# Patient Record
Sex: Male | Born: 1940 | Race: White | Hispanic: No | Marital: Married
Health system: Southern US, Community
[De-identification: ages and names within clinical notes are randomized; demographics above are authoritative.]

---

## 2017-12-24 DIAGNOSIS — E162 Hypoglycemia, unspecified: Secondary | ICD-10-CM | POA: Diagnosis not present

## 2017-12-24 DIAGNOSIS — E11649 Type 2 diabetes mellitus with hypoglycemia without coma: Secondary | ICD-10-CM | POA: Diagnosis not present

## 2017-12-24 DIAGNOSIS — Z7984 Long term (current) use of oral hypoglycemic drugs: Secondary | ICD-10-CM | POA: Diagnosis not present

## 2017-12-24 DIAGNOSIS — R0689 Other abnormalities of breathing: Secondary | ICD-10-CM | POA: Diagnosis not present

## 2017-12-24 DIAGNOSIS — G4700 Insomnia, unspecified: Secondary | ICD-10-CM | POA: Diagnosis not present

## 2017-12-24 DIAGNOSIS — R4182 Altered mental status, unspecified: Secondary | ICD-10-CM | POA: Diagnosis not present

## 2017-12-24 DIAGNOSIS — I10 Essential (primary) hypertension: Secondary | ICD-10-CM | POA: Diagnosis not present

## 2017-12-24 DIAGNOSIS — K219 Gastro-esophageal reflux disease without esophagitis: Secondary | ICD-10-CM | POA: Diagnosis not present

## 2017-12-24 DIAGNOSIS — R410 Disorientation, unspecified: Secondary | ICD-10-CM | POA: Diagnosis not present

## 2017-12-24 DIAGNOSIS — T50905A Adverse effect of unspecified drugs, medicaments and biological substances, initial encounter: Secondary | ICD-10-CM | POA: Diagnosis not present

## 2017-12-24 DIAGNOSIS — E7800 Pure hypercholesterolemia, unspecified: Secondary | ICD-10-CM | POA: Diagnosis not present

## 2017-12-24 DIAGNOSIS — R9431 Abnormal electrocardiogram [ECG] [EKG]: Secondary | ICD-10-CM | POA: Diagnosis not present

## 2017-12-24 DIAGNOSIS — E860 Dehydration: Secondary | ICD-10-CM | POA: Diagnosis not present

## 2017-12-24 DIAGNOSIS — I4510 Unspecified right bundle-branch block: Secondary | ICD-10-CM | POA: Diagnosis not present

## 2017-12-24 DIAGNOSIS — R402441 Other coma, without documented Glasgow coma scale score, or with partial score reported, in the field [EMT or ambulance]: Secondary | ICD-10-CM | POA: Diagnosis not present

## 2017-12-24 DIAGNOSIS — M549 Dorsalgia, unspecified: Secondary | ICD-10-CM | POA: Diagnosis not present

## 2017-12-25 DIAGNOSIS — E11649 Type 2 diabetes mellitus with hypoglycemia without coma: Secondary | ICD-10-CM | POA: Diagnosis not present

## 2017-12-25 DIAGNOSIS — E7800 Pure hypercholesterolemia, unspecified: Secondary | ICD-10-CM | POA: Diagnosis not present

## 2017-12-25 DIAGNOSIS — I10 Essential (primary) hypertension: Secondary | ICD-10-CM | POA: Diagnosis not present

## 2017-12-25 DIAGNOSIS — E860 Dehydration: Secondary | ICD-10-CM | POA: Diagnosis not present

## 2017-12-26 DIAGNOSIS — G4700 Insomnia, unspecified: Secondary | ICD-10-CM | POA: Diagnosis not present

## 2017-12-26 DIAGNOSIS — I10 Essential (primary) hypertension: Secondary | ICD-10-CM | POA: Diagnosis not present

## 2017-12-26 DIAGNOSIS — M1990 Unspecified osteoarthritis, unspecified site: Secondary | ICD-10-CM | POA: Diagnosis not present

## 2017-12-26 DIAGNOSIS — E785 Hyperlipidemia, unspecified: Secondary | ICD-10-CM | POA: Diagnosis not present

## 2017-12-26 DIAGNOSIS — M4186 Other forms of scoliosis, lumbar region: Secondary | ICD-10-CM | POA: Diagnosis not present

## 2017-12-26 DIAGNOSIS — K219 Gastro-esophageal reflux disease without esophagitis: Secondary | ICD-10-CM | POA: Diagnosis not present

## 2017-12-26 DIAGNOSIS — M545 Low back pain: Secondary | ICD-10-CM | POA: Diagnosis not present

## 2017-12-26 DIAGNOSIS — F331 Major depressive disorder, recurrent, moderate: Secondary | ICD-10-CM | POA: Diagnosis not present

## 2017-12-26 DIAGNOSIS — E1142 Type 2 diabetes mellitus with diabetic polyneuropathy: Secondary | ICD-10-CM | POA: Diagnosis not present

## 2017-12-30 DIAGNOSIS — I10 Essential (primary) hypertension: Secondary | ICD-10-CM | POA: Diagnosis not present

## 2017-12-30 DIAGNOSIS — E1165 Type 2 diabetes mellitus with hyperglycemia: Secondary | ICD-10-CM | POA: Diagnosis not present

## 2017-12-30 DIAGNOSIS — E1142 Type 2 diabetes mellitus with diabetic polyneuropathy: Secondary | ICD-10-CM | POA: Diagnosis not present

## 2017-12-30 DIAGNOSIS — Z299 Encounter for prophylactic measures, unspecified: Secondary | ICD-10-CM | POA: Diagnosis not present

## 2017-12-30 DIAGNOSIS — E7800 Pure hypercholesterolemia, unspecified: Secondary | ICD-10-CM | POA: Diagnosis not present

## 2018-01-19 DIAGNOSIS — Z299 Encounter for prophylactic measures, unspecified: Secondary | ICD-10-CM | POA: Diagnosis not present

## 2018-01-19 DIAGNOSIS — Z6823 Body mass index (BMI) 23.0-23.9, adult: Secondary | ICD-10-CM | POA: Diagnosis not present

## 2018-01-19 DIAGNOSIS — I10 Essential (primary) hypertension: Secondary | ICD-10-CM | POA: Diagnosis not present

## 2018-01-19 DIAGNOSIS — E1165 Type 2 diabetes mellitus with hyperglycemia: Secondary | ICD-10-CM | POA: Diagnosis not present

## 2018-01-19 DIAGNOSIS — E1142 Type 2 diabetes mellitus with diabetic polyneuropathy: Secondary | ICD-10-CM | POA: Diagnosis not present

## 2018-03-17 DIAGNOSIS — M25569 Pain in unspecified knee: Secondary | ICD-10-CM | POA: Diagnosis not present

## 2018-03-17 DIAGNOSIS — Z6823 Body mass index (BMI) 23.0-23.9, adult: Secondary | ICD-10-CM | POA: Diagnosis not present

## 2018-03-17 DIAGNOSIS — Z299 Encounter for prophylactic measures, unspecified: Secondary | ICD-10-CM | POA: Diagnosis not present

## 2018-03-17 DIAGNOSIS — Z713 Dietary counseling and surveillance: Secondary | ICD-10-CM | POA: Diagnosis not present

## 2018-03-17 DIAGNOSIS — I10 Essential (primary) hypertension: Secondary | ICD-10-CM | POA: Diagnosis not present

## 2018-04-28 DIAGNOSIS — M1710 Unilateral primary osteoarthritis, unspecified knee: Secondary | ICD-10-CM | POA: Diagnosis not present

## 2018-04-28 DIAGNOSIS — E1165 Type 2 diabetes mellitus with hyperglycemia: Secondary | ICD-10-CM | POA: Diagnosis not present

## 2018-04-28 DIAGNOSIS — Z6823 Body mass index (BMI) 23.0-23.9, adult: Secondary | ICD-10-CM | POA: Diagnosis not present

## 2018-04-28 DIAGNOSIS — Z299 Encounter for prophylactic measures, unspecified: Secondary | ICD-10-CM | POA: Diagnosis not present

## 2018-04-28 DIAGNOSIS — E1142 Type 2 diabetes mellitus with diabetic polyneuropathy: Secondary | ICD-10-CM | POA: Diagnosis not present

## 2018-04-28 DIAGNOSIS — I10 Essential (primary) hypertension: Secondary | ICD-10-CM | POA: Diagnosis not present

## 2018-04-28 DIAGNOSIS — M109 Gout, unspecified: Secondary | ICD-10-CM | POA: Diagnosis not present

## 2018-05-05 DIAGNOSIS — Z299 Encounter for prophylactic measures, unspecified: Secondary | ICD-10-CM | POA: Diagnosis not present

## 2018-05-05 DIAGNOSIS — M25539 Pain in unspecified wrist: Secondary | ICD-10-CM | POA: Diagnosis not present

## 2018-05-05 DIAGNOSIS — Z6823 Body mass index (BMI) 23.0-23.9, adult: Secondary | ICD-10-CM | POA: Diagnosis not present

## 2018-05-05 DIAGNOSIS — I10 Essential (primary) hypertension: Secondary | ICD-10-CM | POA: Diagnosis not present

## 2018-05-05 DIAGNOSIS — M1712 Unilateral primary osteoarthritis, left knee: Secondary | ICD-10-CM | POA: Diagnosis not present

## 2018-05-05 DIAGNOSIS — M79643 Pain in unspecified hand: Secondary | ICD-10-CM | POA: Diagnosis not present

## 2018-05-05 DIAGNOSIS — M1710 Unilateral primary osteoarthritis, unspecified knee: Secondary | ICD-10-CM | POA: Diagnosis not present

## 2018-05-10 DIAGNOSIS — M79643 Pain in unspecified hand: Secondary | ICD-10-CM | POA: Diagnosis not present

## 2018-05-12 DIAGNOSIS — M170 Bilateral primary osteoarthritis of knee: Secondary | ICD-10-CM | POA: Diagnosis not present

## 2018-05-12 DIAGNOSIS — M1711 Unilateral primary osteoarthritis, right knee: Secondary | ICD-10-CM | POA: Diagnosis not present

## 2018-05-12 DIAGNOSIS — M1712 Unilateral primary osteoarthritis, left knee: Secondary | ICD-10-CM | POA: Diagnosis not present

## 2018-05-12 DIAGNOSIS — M19032 Primary osteoarthritis, left wrist: Secondary | ICD-10-CM | POA: Diagnosis not present

## 2018-05-12 DIAGNOSIS — M19041 Primary osteoarthritis, right hand: Secondary | ICD-10-CM | POA: Diagnosis not present

## 2018-05-12 DIAGNOSIS — M19042 Primary osteoarthritis, left hand: Secondary | ICD-10-CM | POA: Diagnosis not present

## 2018-05-12 DIAGNOSIS — M19031 Primary osteoarthritis, right wrist: Secondary | ICD-10-CM | POA: Diagnosis not present

## 2018-08-04 DIAGNOSIS — Z6824 Body mass index (BMI) 24.0-24.9, adult: Secondary | ICD-10-CM | POA: Diagnosis not present

## 2018-08-04 DIAGNOSIS — Z299 Encounter for prophylactic measures, unspecified: Secondary | ICD-10-CM | POA: Diagnosis not present

## 2018-08-04 DIAGNOSIS — E1142 Type 2 diabetes mellitus with diabetic polyneuropathy: Secondary | ICD-10-CM | POA: Diagnosis not present

## 2018-08-04 DIAGNOSIS — I10 Essential (primary) hypertension: Secondary | ICD-10-CM | POA: Diagnosis not present

## 2018-08-04 DIAGNOSIS — E1165 Type 2 diabetes mellitus with hyperglycemia: Secondary | ICD-10-CM | POA: Diagnosis not present

## 2018-12-25 DIAGNOSIS — Z7189 Other specified counseling: Secondary | ICD-10-CM | POA: Diagnosis not present

## 2018-12-25 DIAGNOSIS — Z1339 Encounter for screening examination for other mental health and behavioral disorders: Secondary | ICD-10-CM | POA: Diagnosis not present

## 2018-12-25 DIAGNOSIS — E7800 Pure hypercholesterolemia, unspecified: Secondary | ICD-10-CM | POA: Diagnosis not present

## 2018-12-25 DIAGNOSIS — Z1331 Encounter for screening for depression: Secondary | ICD-10-CM | POA: Diagnosis not present

## 2018-12-25 DIAGNOSIS — Z125 Encounter for screening for malignant neoplasm of prostate: Secondary | ICD-10-CM | POA: Diagnosis not present

## 2018-12-25 DIAGNOSIS — E1165 Type 2 diabetes mellitus with hyperglycemia: Secondary | ICD-10-CM | POA: Diagnosis not present

## 2018-12-25 DIAGNOSIS — Z79899 Other long term (current) drug therapy: Secondary | ICD-10-CM | POA: Diagnosis not present

## 2018-12-25 DIAGNOSIS — Z1211 Encounter for screening for malignant neoplasm of colon: Secondary | ICD-10-CM | POA: Diagnosis not present

## 2018-12-25 DIAGNOSIS — R5383 Other fatigue: Secondary | ICD-10-CM | POA: Diagnosis not present

## 2018-12-25 DIAGNOSIS — F322 Major depressive disorder, single episode, severe without psychotic features: Secondary | ICD-10-CM | POA: Diagnosis not present

## 2018-12-25 DIAGNOSIS — Z0000 Encounter for general adult medical examination without abnormal findings: Secondary | ICD-10-CM | POA: Diagnosis not present

## 2019-01-02 DIAGNOSIS — E1159 Type 2 diabetes mellitus with other circulatory complications: Secondary | ICD-10-CM | POA: Diagnosis not present

## 2019-01-02 DIAGNOSIS — E1140 Type 2 diabetes mellitus with diabetic neuropathy, unspecified: Secondary | ICD-10-CM | POA: Diagnosis not present

## 2019-01-02 DIAGNOSIS — H81393 Other peripheral vertigo, bilateral: Secondary | ICD-10-CM | POA: Diagnosis not present

## 2019-01-15 DIAGNOSIS — F322 Major depressive disorder, single episode, severe without psychotic features: Secondary | ICD-10-CM | POA: Diagnosis not present

## 2019-01-15 DIAGNOSIS — E1142 Type 2 diabetes mellitus with diabetic polyneuropathy: Secondary | ICD-10-CM | POA: Diagnosis not present

## 2019-01-15 DIAGNOSIS — E1165 Type 2 diabetes mellitus with hyperglycemia: Secondary | ICD-10-CM | POA: Diagnosis not present

## 2019-01-15 DIAGNOSIS — Z299 Encounter for prophylactic measures, unspecified: Secondary | ICD-10-CM | POA: Diagnosis not present

## 2019-01-15 DIAGNOSIS — I10 Essential (primary) hypertension: Secondary | ICD-10-CM | POA: Diagnosis not present

## 2019-01-15 DIAGNOSIS — R42 Dizziness and giddiness: Secondary | ICD-10-CM | POA: Diagnosis not present

## 2019-01-15 DIAGNOSIS — Z6824 Body mass index (BMI) 24.0-24.9, adult: Secondary | ICD-10-CM | POA: Diagnosis not present

## 2019-01-22 DIAGNOSIS — E1142 Type 2 diabetes mellitus with diabetic polyneuropathy: Secondary | ICD-10-CM | POA: Diagnosis not present

## 2019-01-22 DIAGNOSIS — F322 Major depressive disorder, single episode, severe without psychotic features: Secondary | ICD-10-CM | POA: Diagnosis not present

## 2019-01-22 DIAGNOSIS — Z6824 Body mass index (BMI) 24.0-24.9, adult: Secondary | ICD-10-CM | POA: Diagnosis not present

## 2019-01-22 DIAGNOSIS — Z299 Encounter for prophylactic measures, unspecified: Secondary | ICD-10-CM | POA: Diagnosis not present

## 2019-01-22 DIAGNOSIS — E1165 Type 2 diabetes mellitus with hyperglycemia: Secondary | ICD-10-CM | POA: Diagnosis not present

## 2019-01-22 DIAGNOSIS — N184 Chronic kidney disease, stage 4 (severe): Secondary | ICD-10-CM | POA: Diagnosis not present

## 2019-01-22 DIAGNOSIS — I10 Essential (primary) hypertension: Secondary | ICD-10-CM | POA: Diagnosis not present

## 2019-03-02 DIAGNOSIS — E1165 Type 2 diabetes mellitus with hyperglycemia: Secondary | ICD-10-CM | POA: Diagnosis not present

## 2019-03-02 DIAGNOSIS — F329 Major depressive disorder, single episode, unspecified: Secondary | ICD-10-CM | POA: Diagnosis not present

## 2019-03-02 DIAGNOSIS — E7800 Pure hypercholesterolemia, unspecified: Secondary | ICD-10-CM | POA: Diagnosis not present

## 2019-04-05 ENCOUNTER — Other Ambulatory Visit: Payer: Self-pay

## 2019-04-05 DIAGNOSIS — Z20822 Contact with and (suspected) exposure to covid-19: Secondary | ICD-10-CM

## 2019-04-06 ENCOUNTER — Telehealth: Payer: Self-pay | Admitting: *Deleted

## 2019-04-06 LAB — NOVEL CORONAVIRUS, NAA: SARS-CoV-2, NAA: NOT DETECTED

## 2019-04-06 NOTE — Telephone Encounter (Signed)
Patient given COVID - 19 test results, verbalized understanding.

## 2019-04-09 DIAGNOSIS — M1710 Unilateral primary osteoarthritis, unspecified knee: Secondary | ICD-10-CM | POA: Diagnosis not present

## 2019-04-09 DIAGNOSIS — E1142 Type 2 diabetes mellitus with diabetic polyneuropathy: Secondary | ICD-10-CM | POA: Diagnosis not present

## 2019-04-09 DIAGNOSIS — E1165 Type 2 diabetes mellitus with hyperglycemia: Secondary | ICD-10-CM | POA: Diagnosis not present

## 2019-04-09 DIAGNOSIS — Z6824 Body mass index (BMI) 24.0-24.9, adult: Secondary | ICD-10-CM | POA: Diagnosis not present

## 2019-04-09 DIAGNOSIS — M545 Low back pain: Secondary | ICD-10-CM | POA: Diagnosis not present

## 2019-04-09 DIAGNOSIS — Z299 Encounter for prophylactic measures, unspecified: Secondary | ICD-10-CM | POA: Diagnosis not present

## 2019-04-09 DIAGNOSIS — I10 Essential (primary) hypertension: Secondary | ICD-10-CM | POA: Diagnosis not present

## 2019-04-10 DIAGNOSIS — Z6823 Body mass index (BMI) 23.0-23.9, adult: Secondary | ICD-10-CM | POA: Diagnosis not present

## 2019-04-10 DIAGNOSIS — E1165 Type 2 diabetes mellitus with hyperglycemia: Secondary | ICD-10-CM | POA: Diagnosis not present

## 2019-04-10 DIAGNOSIS — M1710 Unilateral primary osteoarthritis, unspecified knee: Secondary | ICD-10-CM | POA: Diagnosis not present

## 2019-04-10 DIAGNOSIS — M25562 Pain in left knee: Secondary | ICD-10-CM | POA: Diagnosis not present

## 2019-04-10 DIAGNOSIS — I10 Essential (primary) hypertension: Secondary | ICD-10-CM | POA: Diagnosis not present

## 2019-04-10 DIAGNOSIS — Z789 Other specified health status: Secondary | ICD-10-CM | POA: Diagnosis not present

## 2019-04-10 DIAGNOSIS — M1712 Unilateral primary osteoarthritis, left knee: Secondary | ICD-10-CM | POA: Diagnosis not present

## 2019-04-10 DIAGNOSIS — Z299 Encounter for prophylactic measures, unspecified: Secondary | ICD-10-CM | POA: Diagnosis not present

## 2019-04-12 DIAGNOSIS — M25561 Pain in right knee: Secondary | ICD-10-CM | POA: Diagnosis not present

## 2019-04-12 DIAGNOSIS — Z6823 Body mass index (BMI) 23.0-23.9, adult: Secondary | ICD-10-CM | POA: Diagnosis not present

## 2019-04-12 DIAGNOSIS — E1165 Type 2 diabetes mellitus with hyperglycemia: Secondary | ICD-10-CM | POA: Diagnosis not present

## 2019-04-12 DIAGNOSIS — M25562 Pain in left knee: Secondary | ICD-10-CM | POA: Diagnosis not present

## 2019-04-12 DIAGNOSIS — I10 Essential (primary) hypertension: Secondary | ICD-10-CM | POA: Diagnosis not present

## 2019-04-12 DIAGNOSIS — Z299 Encounter for prophylactic measures, unspecified: Secondary | ICD-10-CM | POA: Diagnosis not present

## 2019-04-25 DIAGNOSIS — E7800 Pure hypercholesterolemia, unspecified: Secondary | ICD-10-CM | POA: Diagnosis not present

## 2019-04-25 DIAGNOSIS — Z299 Encounter for prophylactic measures, unspecified: Secondary | ICD-10-CM | POA: Diagnosis not present

## 2019-04-25 DIAGNOSIS — E1165 Type 2 diabetes mellitus with hyperglycemia: Secondary | ICD-10-CM | POA: Diagnosis not present

## 2019-04-25 DIAGNOSIS — F329 Major depressive disorder, single episode, unspecified: Secondary | ICD-10-CM | POA: Diagnosis not present

## 2019-05-08 DIAGNOSIS — E1165 Type 2 diabetes mellitus with hyperglycemia: Secondary | ICD-10-CM | POA: Diagnosis not present

## 2019-05-24 DIAGNOSIS — I10 Essential (primary) hypertension: Secondary | ICD-10-CM | POA: Diagnosis not present

## 2019-06-18 DIAGNOSIS — I10 Essential (primary) hypertension: Secondary | ICD-10-CM | POA: Diagnosis not present

## 2019-07-03 DIAGNOSIS — E7800 Pure hypercholesterolemia, unspecified: Secondary | ICD-10-CM | POA: Diagnosis not present

## 2019-07-03 DIAGNOSIS — F329 Major depressive disorder, single episode, unspecified: Secondary | ICD-10-CM | POA: Diagnosis not present

## 2019-07-03 DIAGNOSIS — E1165 Type 2 diabetes mellitus with hyperglycemia: Secondary | ICD-10-CM | POA: Diagnosis not present

## 2019-07-10 DIAGNOSIS — I10 Essential (primary) hypertension: Secondary | ICD-10-CM | POA: Diagnosis not present

## 2019-07-17 DIAGNOSIS — F322 Major depressive disorder, single episode, severe without psychotic features: Secondary | ICD-10-CM | POA: Diagnosis not present

## 2019-07-17 DIAGNOSIS — Z299 Encounter for prophylactic measures, unspecified: Secondary | ICD-10-CM | POA: Diagnosis not present

## 2019-07-17 DIAGNOSIS — E1165 Type 2 diabetes mellitus with hyperglycemia: Secondary | ICD-10-CM | POA: Diagnosis not present

## 2019-07-17 DIAGNOSIS — I10 Essential (primary) hypertension: Secondary | ICD-10-CM | POA: Diagnosis not present

## 2019-07-17 DIAGNOSIS — Z6824 Body mass index (BMI) 24.0-24.9, adult: Secondary | ICD-10-CM | POA: Diagnosis not present

## 2019-08-14 DIAGNOSIS — F329 Major depressive disorder, single episode, unspecified: Secondary | ICD-10-CM | POA: Diagnosis not present

## 2019-08-14 DIAGNOSIS — E1165 Type 2 diabetes mellitus with hyperglycemia: Secondary | ICD-10-CM | POA: Diagnosis not present

## 2019-08-14 DIAGNOSIS — E7800 Pure hypercholesterolemia, unspecified: Secondary | ICD-10-CM | POA: Diagnosis not present

## 2019-08-22 DIAGNOSIS — I10 Essential (primary) hypertension: Secondary | ICD-10-CM | POA: Diagnosis not present

## 2019-09-20 DIAGNOSIS — N184 Chronic kidney disease, stage 4 (severe): Secondary | ICD-10-CM | POA: Diagnosis not present

## 2019-09-20 DIAGNOSIS — E1165 Type 2 diabetes mellitus with hyperglycemia: Secondary | ICD-10-CM | POA: Diagnosis not present

## 2019-09-20 DIAGNOSIS — I10 Essential (primary) hypertension: Secondary | ICD-10-CM | POA: Diagnosis not present

## 2019-09-20 DIAGNOSIS — M25561 Pain in right knee: Secondary | ICD-10-CM | POA: Diagnosis not present

## 2019-09-20 DIAGNOSIS — M25569 Pain in unspecified knee: Secondary | ICD-10-CM | POA: Diagnosis not present

## 2019-09-20 DIAGNOSIS — Z6824 Body mass index (BMI) 24.0-24.9, adult: Secondary | ICD-10-CM | POA: Diagnosis not present

## 2019-09-23 DIAGNOSIS — I10 Essential (primary) hypertension: Secondary | ICD-10-CM | POA: Diagnosis not present

## 2019-10-16 DIAGNOSIS — E7800 Pure hypercholesterolemia, unspecified: Secondary | ICD-10-CM | POA: Diagnosis not present

## 2019-10-16 DIAGNOSIS — F329 Major depressive disorder, single episode, unspecified: Secondary | ICD-10-CM | POA: Diagnosis not present

## 2019-10-16 DIAGNOSIS — E1165 Type 2 diabetes mellitus with hyperglycemia: Secondary | ICD-10-CM | POA: Diagnosis not present

## 2019-10-22 DIAGNOSIS — Z299 Encounter for prophylactic measures, unspecified: Secondary | ICD-10-CM | POA: Diagnosis not present

## 2019-10-22 DIAGNOSIS — F322 Major depressive disorder, single episode, severe without psychotic features: Secondary | ICD-10-CM | POA: Diagnosis not present

## 2019-10-22 DIAGNOSIS — E1142 Type 2 diabetes mellitus with diabetic polyneuropathy: Secondary | ICD-10-CM | POA: Diagnosis not present

## 2019-10-22 DIAGNOSIS — I10 Essential (primary) hypertension: Secondary | ICD-10-CM | POA: Diagnosis not present

## 2019-10-22 DIAGNOSIS — N184 Chronic kidney disease, stage 4 (severe): Secondary | ICD-10-CM | POA: Diagnosis not present

## 2019-10-22 DIAGNOSIS — E1165 Type 2 diabetes mellitus with hyperglycemia: Secondary | ICD-10-CM | POA: Diagnosis not present

## 2019-10-23 DIAGNOSIS — I10 Essential (primary) hypertension: Secondary | ICD-10-CM | POA: Diagnosis not present

## 2019-10-29 DIAGNOSIS — E7800 Pure hypercholesterolemia, unspecified: Secondary | ICD-10-CM | POA: Diagnosis not present

## 2019-10-29 DIAGNOSIS — E1165 Type 2 diabetes mellitus with hyperglycemia: Secondary | ICD-10-CM | POA: Diagnosis not present

## 2019-10-29 DIAGNOSIS — F329 Major depressive disorder, single episode, unspecified: Secondary | ICD-10-CM | POA: Diagnosis not present

## 2019-11-23 DIAGNOSIS — I10 Essential (primary) hypertension: Secondary | ICD-10-CM | POA: Diagnosis not present

## 2019-12-23 DIAGNOSIS — E1165 Type 2 diabetes mellitus with hyperglycemia: Secondary | ICD-10-CM | POA: Diagnosis not present

## 2019-12-23 DIAGNOSIS — E7800 Pure hypercholesterolemia, unspecified: Secondary | ICD-10-CM | POA: Diagnosis not present

## 2019-12-23 DIAGNOSIS — F329 Major depressive disorder, single episode, unspecified: Secondary | ICD-10-CM | POA: Diagnosis not present

## 2019-12-23 DIAGNOSIS — I10 Essential (primary) hypertension: Secondary | ICD-10-CM | POA: Diagnosis not present

## 2020-01-14 DIAGNOSIS — Z1339 Encounter for screening examination for other mental health and behavioral disorders: Secondary | ICD-10-CM | POA: Diagnosis not present

## 2020-01-14 DIAGNOSIS — E1165 Type 2 diabetes mellitus with hyperglycemia: Secondary | ICD-10-CM | POA: Diagnosis not present

## 2020-01-14 DIAGNOSIS — R5383 Other fatigue: Secondary | ICD-10-CM | POA: Diagnosis not present

## 2020-01-14 DIAGNOSIS — Z6824 Body mass index (BMI) 24.0-24.9, adult: Secondary | ICD-10-CM | POA: Diagnosis not present

## 2020-01-14 DIAGNOSIS — Z1331 Encounter for screening for depression: Secondary | ICD-10-CM | POA: Diagnosis not present

## 2020-01-14 DIAGNOSIS — Z1211 Encounter for screening for malignant neoplasm of colon: Secondary | ICD-10-CM | POA: Diagnosis not present

## 2020-01-14 DIAGNOSIS — Z7189 Other specified counseling: Secondary | ICD-10-CM | POA: Diagnosis not present

## 2020-01-14 DIAGNOSIS — E7800 Pure hypercholesterolemia, unspecified: Secondary | ICD-10-CM | POA: Diagnosis not present

## 2020-01-14 DIAGNOSIS — Z0000 Encounter for general adult medical examination without abnormal findings: Secondary | ICD-10-CM | POA: Diagnosis not present

## 2020-01-14 DIAGNOSIS — I10 Essential (primary) hypertension: Secondary | ICD-10-CM | POA: Diagnosis not present

## 2020-01-15 DIAGNOSIS — R5383 Other fatigue: Secondary | ICD-10-CM | POA: Diagnosis not present

## 2020-01-15 DIAGNOSIS — E7800 Pure hypercholesterolemia, unspecified: Secondary | ICD-10-CM | POA: Diagnosis not present

## 2020-01-15 DIAGNOSIS — Z79899 Other long term (current) drug therapy: Secondary | ICD-10-CM | POA: Diagnosis not present

## 2020-01-15 DIAGNOSIS — Z125 Encounter for screening for malignant neoplasm of prostate: Secondary | ICD-10-CM | POA: Diagnosis not present

## 2020-01-23 DIAGNOSIS — E1165 Type 2 diabetes mellitus with hyperglycemia: Secondary | ICD-10-CM | POA: Diagnosis not present

## 2020-01-23 DIAGNOSIS — R69 Illness, unspecified: Secondary | ICD-10-CM | POA: Diagnosis not present

## 2020-01-23 DIAGNOSIS — I10 Essential (primary) hypertension: Secondary | ICD-10-CM | POA: Diagnosis not present

## 2020-01-23 DIAGNOSIS — E7800 Pure hypercholesterolemia, unspecified: Secondary | ICD-10-CM | POA: Diagnosis not present

## 2020-02-22 DIAGNOSIS — E7800 Pure hypercholesterolemia, unspecified: Secondary | ICD-10-CM | POA: Diagnosis not present

## 2020-02-22 DIAGNOSIS — I10 Essential (primary) hypertension: Secondary | ICD-10-CM | POA: Diagnosis not present

## 2020-02-22 DIAGNOSIS — E1165 Type 2 diabetes mellitus with hyperglycemia: Secondary | ICD-10-CM | POA: Diagnosis not present

## 2020-02-22 DIAGNOSIS — R69 Illness, unspecified: Secondary | ICD-10-CM | POA: Diagnosis not present

## 2020-02-25 DIAGNOSIS — Z7984 Long term (current) use of oral hypoglycemic drugs: Secondary | ICD-10-CM | POA: Diagnosis not present

## 2020-02-25 DIAGNOSIS — K5900 Constipation, unspecified: Secondary | ICD-10-CM | POA: Diagnosis not present

## 2020-02-25 DIAGNOSIS — E1165 Type 2 diabetes mellitus with hyperglycemia: Secondary | ICD-10-CM | POA: Diagnosis not present

## 2020-02-25 DIAGNOSIS — G8929 Other chronic pain: Secondary | ICD-10-CM | POA: Diagnosis not present

## 2020-02-25 DIAGNOSIS — E1142 Type 2 diabetes mellitus with diabetic polyneuropathy: Secondary | ICD-10-CM | POA: Diagnosis not present

## 2020-02-25 DIAGNOSIS — I10 Essential (primary) hypertension: Secondary | ICD-10-CM | POA: Diagnosis not present

## 2020-02-25 DIAGNOSIS — E119 Type 2 diabetes mellitus without complications: Secondary | ICD-10-CM | POA: Diagnosis not present

## 2020-02-25 DIAGNOSIS — Z299 Encounter for prophylactic measures, unspecified: Secondary | ICD-10-CM | POA: Diagnosis not present

## 2020-02-25 DIAGNOSIS — E1122 Type 2 diabetes mellitus with diabetic chronic kidney disease: Secondary | ICD-10-CM | POA: Diagnosis not present

## 2020-03-14 DIAGNOSIS — R69 Illness, unspecified: Secondary | ICD-10-CM | POA: Diagnosis not present

## 2020-03-14 DIAGNOSIS — E7800 Pure hypercholesterolemia, unspecified: Secondary | ICD-10-CM | POA: Diagnosis not present

## 2020-03-14 DIAGNOSIS — E1165 Type 2 diabetes mellitus with hyperglycemia: Secondary | ICD-10-CM | POA: Diagnosis not present

## 2020-03-25 DIAGNOSIS — I10 Essential (primary) hypertension: Secondary | ICD-10-CM | POA: Diagnosis not present

## 2020-04-21 DIAGNOSIS — E1165 Type 2 diabetes mellitus with hyperglycemia: Secondary | ICD-10-CM | POA: Diagnosis not present

## 2020-04-21 DIAGNOSIS — R5383 Other fatigue: Secondary | ICD-10-CM | POA: Diagnosis not present

## 2020-04-21 DIAGNOSIS — E559 Vitamin D deficiency, unspecified: Secondary | ICD-10-CM | POA: Diagnosis not present

## 2020-04-21 DIAGNOSIS — E1142 Type 2 diabetes mellitus with diabetic polyneuropathy: Secondary | ICD-10-CM | POA: Diagnosis not present

## 2020-04-21 DIAGNOSIS — I10 Essential (primary) hypertension: Secondary | ICD-10-CM | POA: Diagnosis not present

## 2020-04-21 DIAGNOSIS — E538 Deficiency of other specified B group vitamins: Secondary | ICD-10-CM | POA: Diagnosis not present

## 2020-04-21 DIAGNOSIS — Z299 Encounter for prophylactic measures, unspecified: Secondary | ICD-10-CM | POA: Diagnosis not present

## 2020-04-24 DIAGNOSIS — E7800 Pure hypercholesterolemia, unspecified: Secondary | ICD-10-CM | POA: Diagnosis not present

## 2020-04-24 DIAGNOSIS — E1165 Type 2 diabetes mellitus with hyperglycemia: Secondary | ICD-10-CM | POA: Diagnosis not present

## 2020-04-24 DIAGNOSIS — I10 Essential (primary) hypertension: Secondary | ICD-10-CM | POA: Diagnosis not present

## 2020-04-24 DIAGNOSIS — R69 Illness, unspecified: Secondary | ICD-10-CM | POA: Diagnosis not present

## 2020-05-12 DIAGNOSIS — E039 Hypothyroidism, unspecified: Secondary | ICD-10-CM | POA: Diagnosis not present

## 2020-05-12 DIAGNOSIS — E1165 Type 2 diabetes mellitus with hyperglycemia: Secondary | ICD-10-CM | POA: Diagnosis not present

## 2020-05-12 DIAGNOSIS — I10 Essential (primary) hypertension: Secondary | ICD-10-CM | POA: Diagnosis not present

## 2020-05-12 DIAGNOSIS — E1142 Type 2 diabetes mellitus with diabetic polyneuropathy: Secondary | ICD-10-CM | POA: Diagnosis not present

## 2020-05-12 DIAGNOSIS — Z299 Encounter for prophylactic measures, unspecified: Secondary | ICD-10-CM | POA: Diagnosis not present

## 2020-05-12 DIAGNOSIS — N184 Chronic kidney disease, stage 4 (severe): Secondary | ICD-10-CM | POA: Diagnosis not present

## 2020-05-16 DIAGNOSIS — I10 Essential (primary) hypertension: Secondary | ICD-10-CM | POA: Diagnosis not present

## 2020-05-16 DIAGNOSIS — E1165 Type 2 diabetes mellitus with hyperglycemia: Secondary | ICD-10-CM | POA: Diagnosis not present

## 2020-05-16 DIAGNOSIS — Z299 Encounter for prophylactic measures, unspecified: Secondary | ICD-10-CM | POA: Diagnosis not present

## 2020-05-16 DIAGNOSIS — M545 Low back pain, unspecified: Secondary | ICD-10-CM | POA: Diagnosis not present

## 2020-05-16 DIAGNOSIS — E1142 Type 2 diabetes mellitus with diabetic polyneuropathy: Secondary | ICD-10-CM | POA: Diagnosis not present

## 2020-05-22 DIAGNOSIS — M109 Gout, unspecified: Secondary | ICD-10-CM | POA: Diagnosis not present

## 2020-05-23 DIAGNOSIS — R69 Illness, unspecified: Secondary | ICD-10-CM | POA: Diagnosis not present

## 2020-05-23 DIAGNOSIS — I10 Essential (primary) hypertension: Secondary | ICD-10-CM | POA: Diagnosis not present

## 2020-05-23 DIAGNOSIS — R069 Unspecified abnormalities of breathing: Secondary | ICD-10-CM | POA: Diagnosis not present

## 2020-05-23 DIAGNOSIS — E1165 Type 2 diabetes mellitus with hyperglycemia: Secondary | ICD-10-CM | POA: Diagnosis not present

## 2020-05-23 DIAGNOSIS — R52 Pain, unspecified: Secondary | ICD-10-CM | POA: Diagnosis not present

## 2020-05-23 DIAGNOSIS — E7800 Pure hypercholesterolemia, unspecified: Secondary | ICD-10-CM | POA: Diagnosis not present

## 2020-05-23 DIAGNOSIS — R064 Hyperventilation: Secondary | ICD-10-CM | POA: Diagnosis not present

## 2020-05-24 DIAGNOSIS — I10 Essential (primary) hypertension: Secondary | ICD-10-CM | POA: Diagnosis not present

## 2020-05-25 DIAGNOSIS — I4891 Unspecified atrial fibrillation: Secondary | ICD-10-CM | POA: Diagnosis not present

## 2020-05-25 DIAGNOSIS — I480 Paroxysmal atrial fibrillation: Secondary | ICD-10-CM | POA: Diagnosis not present

## 2020-05-25 DIAGNOSIS — E161 Other hypoglycemia: Secondary | ICD-10-CM | POA: Diagnosis not present

## 2020-05-25 DIAGNOSIS — R262 Difficulty in walking, not elsewhere classified: Secondary | ICD-10-CM | POA: Diagnosis not present

## 2020-05-25 DIAGNOSIS — I6523 Occlusion and stenosis of bilateral carotid arteries: Secondary | ICD-10-CM | POA: Diagnosis not present

## 2020-05-25 DIAGNOSIS — R4182 Altered mental status, unspecified: Secondary | ICD-10-CM | POA: Diagnosis not present

## 2020-05-25 DIAGNOSIS — I083 Combined rheumatic disorders of mitral, aortic and tricuspid valves: Secondary | ICD-10-CM | POA: Diagnosis not present

## 2020-05-25 DIAGNOSIS — R778 Other specified abnormalities of plasma proteins: Secondary | ICD-10-CM | POA: Diagnosis not present

## 2020-05-25 DIAGNOSIS — N179 Acute kidney failure, unspecified: Secondary | ICD-10-CM | POA: Diagnosis not present

## 2020-05-25 DIAGNOSIS — I429 Cardiomyopathy, unspecified: Secondary | ICD-10-CM | POA: Diagnosis not present

## 2020-05-25 DIAGNOSIS — E785 Hyperlipidemia, unspecified: Secondary | ICD-10-CM | POA: Diagnosis not present

## 2020-05-25 DIAGNOSIS — I248 Other forms of acute ischemic heart disease: Secondary | ICD-10-CM | POA: Diagnosis not present

## 2020-05-25 DIAGNOSIS — I10 Essential (primary) hypertension: Secondary | ICD-10-CM | POA: Diagnosis not present

## 2020-05-25 DIAGNOSIS — E162 Hypoglycemia, unspecified: Secondary | ICD-10-CM | POA: Diagnosis not present

## 2020-05-25 DIAGNOSIS — R9431 Abnormal electrocardiogram [ECG] [EKG]: Secondary | ICD-10-CM | POA: Diagnosis not present

## 2020-05-25 DIAGNOSIS — G9389 Other specified disorders of brain: Secondary | ICD-10-CM | POA: Diagnosis not present

## 2020-05-25 DIAGNOSIS — R0902 Hypoxemia: Secondary | ICD-10-CM | POA: Diagnosis not present

## 2020-05-25 DIAGNOSIS — I499 Cardiac arrhythmia, unspecified: Secondary | ICD-10-CM | POA: Diagnosis not present

## 2020-05-25 DIAGNOSIS — E119 Type 2 diabetes mellitus without complications: Secondary | ICD-10-CM | POA: Diagnosis not present

## 2020-05-25 DIAGNOSIS — R5381 Other malaise: Secondary | ICD-10-CM | POA: Diagnosis not present

## 2020-05-25 DIAGNOSIS — R531 Weakness: Secondary | ICD-10-CM | POA: Diagnosis not present

## 2020-05-25 DIAGNOSIS — Z20822 Contact with and (suspected) exposure to covid-19: Secondary | ICD-10-CM | POA: Diagnosis not present

## 2020-05-25 DIAGNOSIS — D649 Anemia, unspecified: Secondary | ICD-10-CM | POA: Diagnosis not present

## 2020-05-26 DIAGNOSIS — I083 Combined rheumatic disorders of mitral, aortic and tricuspid valves: Secondary | ICD-10-CM | POA: Diagnosis not present

## 2020-05-26 DIAGNOSIS — R778 Other specified abnormalities of plasma proteins: Secondary | ICD-10-CM | POA: Diagnosis not present

## 2020-05-26 DIAGNOSIS — I429 Cardiomyopathy, unspecified: Secondary | ICD-10-CM | POA: Diagnosis not present

## 2020-05-26 DIAGNOSIS — I10 Essential (primary) hypertension: Secondary | ICD-10-CM | POA: Diagnosis not present

## 2020-05-26 DIAGNOSIS — E785 Hyperlipidemia, unspecified: Secondary | ICD-10-CM | POA: Diagnosis not present

## 2020-05-26 DIAGNOSIS — E119 Type 2 diabetes mellitus without complications: Secondary | ICD-10-CM | POA: Diagnosis not present

## 2020-05-26 DIAGNOSIS — I480 Paroxysmal atrial fibrillation: Secondary | ICD-10-CM | POA: Diagnosis not present

## 2020-05-26 DIAGNOSIS — I499 Cardiac arrhythmia, unspecified: Secondary | ICD-10-CM | POA: Diagnosis not present

## 2020-05-26 DIAGNOSIS — N179 Acute kidney failure, unspecified: Secondary | ICD-10-CM | POA: Diagnosis not present

## 2020-05-26 DIAGNOSIS — D649 Anemia, unspecified: Secondary | ICD-10-CM | POA: Diagnosis not present

## 2020-05-26 DIAGNOSIS — R9431 Abnormal electrocardiogram [ECG] [EKG]: Secondary | ICD-10-CM | POA: Diagnosis not present

## 2020-05-29 DIAGNOSIS — I214 Non-ST elevation (NSTEMI) myocardial infarction: Secondary | ICD-10-CM | POA: Diagnosis not present

## 2020-05-29 DIAGNOSIS — I2699 Other pulmonary embolism without acute cor pulmonale: Secondary | ICD-10-CM | POA: Diagnosis not present

## 2020-05-29 DIAGNOSIS — E1136 Type 2 diabetes mellitus with diabetic cataract: Secondary | ICD-10-CM | POA: Diagnosis not present

## 2020-05-29 DIAGNOSIS — G40909 Epilepsy, unspecified, not intractable, without status epilepticus: Secondary | ICD-10-CM | POA: Diagnosis not present

## 2020-05-29 DIAGNOSIS — J449 Chronic obstructive pulmonary disease, unspecified: Secondary | ICD-10-CM | POA: Diagnosis not present

## 2020-05-29 DIAGNOSIS — I4891 Unspecified atrial fibrillation: Secondary | ICD-10-CM | POA: Diagnosis not present

## 2020-05-29 DIAGNOSIS — J849 Interstitial pulmonary disease, unspecified: Secondary | ICD-10-CM | POA: Diagnosis not present

## 2020-05-29 DIAGNOSIS — N189 Chronic kidney disease, unspecified: Secondary | ICD-10-CM | POA: Diagnosis not present

## 2020-05-29 DIAGNOSIS — I10 Essential (primary) hypertension: Secondary | ICD-10-CM | POA: Diagnosis not present

## 2020-05-29 DIAGNOSIS — E1122 Type 2 diabetes mellitus with diabetic chronic kidney disease: Secondary | ICD-10-CM | POA: Diagnosis not present

## 2020-05-29 DIAGNOSIS — I129 Hypertensive chronic kidney disease with stage 1 through stage 4 chronic kidney disease, or unspecified chronic kidney disease: Secondary | ICD-10-CM | POA: Diagnosis not present

## 2020-05-30 DIAGNOSIS — G40909 Epilepsy, unspecified, not intractable, without status epilepticus: Secondary | ICD-10-CM | POA: Diagnosis not present

## 2020-05-30 DIAGNOSIS — N189 Chronic kidney disease, unspecified: Secondary | ICD-10-CM | POA: Diagnosis not present

## 2020-05-30 DIAGNOSIS — I214 Non-ST elevation (NSTEMI) myocardial infarction: Secondary | ICD-10-CM | POA: Diagnosis not present

## 2020-05-30 DIAGNOSIS — E1122 Type 2 diabetes mellitus with diabetic chronic kidney disease: Secondary | ICD-10-CM | POA: Diagnosis not present

## 2020-05-30 DIAGNOSIS — I129 Hypertensive chronic kidney disease with stage 1 through stage 4 chronic kidney disease, or unspecified chronic kidney disease: Secondary | ICD-10-CM | POA: Diagnosis not present

## 2020-05-30 DIAGNOSIS — J849 Interstitial pulmonary disease, unspecified: Secondary | ICD-10-CM | POA: Diagnosis not present

## 2020-05-30 DIAGNOSIS — J449 Chronic obstructive pulmonary disease, unspecified: Secondary | ICD-10-CM | POA: Diagnosis not present

## 2020-05-30 DIAGNOSIS — E1136 Type 2 diabetes mellitus with diabetic cataract: Secondary | ICD-10-CM | POA: Diagnosis not present

## 2020-05-30 DIAGNOSIS — I2699 Other pulmonary embolism without acute cor pulmonale: Secondary | ICD-10-CM | POA: Diagnosis not present

## 2020-05-30 DIAGNOSIS — I4891 Unspecified atrial fibrillation: Secondary | ICD-10-CM | POA: Diagnosis not present

## 2020-06-03 DIAGNOSIS — I2699 Other pulmonary embolism without acute cor pulmonale: Secondary | ICD-10-CM | POA: Diagnosis not present

## 2020-06-03 DIAGNOSIS — I214 Non-ST elevation (NSTEMI) myocardial infarction: Secondary | ICD-10-CM | POA: Diagnosis not present

## 2020-06-03 DIAGNOSIS — I129 Hypertensive chronic kidney disease with stage 1 through stage 4 chronic kidney disease, or unspecified chronic kidney disease: Secondary | ICD-10-CM | POA: Diagnosis not present

## 2020-06-03 DIAGNOSIS — I4891 Unspecified atrial fibrillation: Secondary | ICD-10-CM | POA: Diagnosis not present

## 2020-06-18 DIAGNOSIS — J849 Interstitial pulmonary disease, unspecified: Secondary | ICD-10-CM | POA: Diagnosis not present

## 2020-06-18 DIAGNOSIS — I2699 Other pulmonary embolism without acute cor pulmonale: Secondary | ICD-10-CM | POA: Diagnosis not present

## 2020-06-18 DIAGNOSIS — E1136 Type 2 diabetes mellitus with diabetic cataract: Secondary | ICD-10-CM | POA: Diagnosis not present

## 2020-06-18 DIAGNOSIS — G40909 Epilepsy, unspecified, not intractable, without status epilepticus: Secondary | ICD-10-CM | POA: Diagnosis not present

## 2020-06-18 DIAGNOSIS — J449 Chronic obstructive pulmonary disease, unspecified: Secondary | ICD-10-CM | POA: Diagnosis not present

## 2020-06-18 DIAGNOSIS — N189 Chronic kidney disease, unspecified: Secondary | ICD-10-CM | POA: Diagnosis not present

## 2020-06-18 DIAGNOSIS — I214 Non-ST elevation (NSTEMI) myocardial infarction: Secondary | ICD-10-CM | POA: Diagnosis not present

## 2020-06-18 DIAGNOSIS — E1122 Type 2 diabetes mellitus with diabetic chronic kidney disease: Secondary | ICD-10-CM | POA: Diagnosis not present

## 2020-06-18 DIAGNOSIS — I4891 Unspecified atrial fibrillation: Secondary | ICD-10-CM | POA: Diagnosis not present

## 2020-06-18 DIAGNOSIS — I129 Hypertensive chronic kidney disease with stage 1 through stage 4 chronic kidney disease, or unspecified chronic kidney disease: Secondary | ICD-10-CM | POA: Diagnosis not present

## 2020-06-24 DIAGNOSIS — E7800 Pure hypercholesterolemia, unspecified: Secondary | ICD-10-CM | POA: Diagnosis not present

## 2020-06-24 DIAGNOSIS — I10 Essential (primary) hypertension: Secondary | ICD-10-CM | POA: Diagnosis not present

## 2020-06-24 DIAGNOSIS — R69 Illness, unspecified: Secondary | ICD-10-CM | POA: Diagnosis not present

## 2020-06-24 DIAGNOSIS — E1165 Type 2 diabetes mellitus with hyperglycemia: Secondary | ICD-10-CM | POA: Diagnosis not present

## 2020-06-26 DIAGNOSIS — N184 Chronic kidney disease, stage 4 (severe): Secondary | ICD-10-CM | POA: Diagnosis not present

## 2020-06-26 DIAGNOSIS — Z299 Encounter for prophylactic measures, unspecified: Secondary | ICD-10-CM | POA: Diagnosis not present

## 2020-06-26 DIAGNOSIS — E1165 Type 2 diabetes mellitus with hyperglycemia: Secondary | ICD-10-CM | POA: Diagnosis not present

## 2020-06-26 DIAGNOSIS — E119 Type 2 diabetes mellitus without complications: Secondary | ICD-10-CM | POA: Diagnosis not present

## 2020-06-26 DIAGNOSIS — I10 Essential (primary) hypertension: Secondary | ICD-10-CM | POA: Diagnosis not present

## 2020-06-26 DIAGNOSIS — E1122 Type 2 diabetes mellitus with diabetic chronic kidney disease: Secondary | ICD-10-CM | POA: Diagnosis not present

## 2020-07-24 DIAGNOSIS — I10 Essential (primary) hypertension: Secondary | ICD-10-CM | POA: Diagnosis not present

## 2020-07-31 DIAGNOSIS — I10 Essential (primary) hypertension: Secondary | ICD-10-CM | POA: Diagnosis not present

## 2020-07-31 DIAGNOSIS — Z6822 Body mass index (BMI) 22.0-22.9, adult: Secondary | ICD-10-CM | POA: Diagnosis not present

## 2020-07-31 DIAGNOSIS — E1122 Type 2 diabetes mellitus with diabetic chronic kidney disease: Secondary | ICD-10-CM | POA: Diagnosis not present

## 2020-07-31 DIAGNOSIS — E1165 Type 2 diabetes mellitus with hyperglycemia: Secondary | ICD-10-CM | POA: Diagnosis not present

## 2020-07-31 DIAGNOSIS — Z789 Other specified health status: Secondary | ICD-10-CM | POA: Diagnosis not present

## 2020-07-31 DIAGNOSIS — N184 Chronic kidney disease, stage 4 (severe): Secondary | ICD-10-CM | POA: Diagnosis not present

## 2020-07-31 DIAGNOSIS — Z299 Encounter for prophylactic measures, unspecified: Secondary | ICD-10-CM | POA: Diagnosis not present

## 2020-07-31 DIAGNOSIS — E1142 Type 2 diabetes mellitus with diabetic polyneuropathy: Secondary | ICD-10-CM | POA: Diagnosis not present

## 2020-07-31 DIAGNOSIS — D692 Other nonthrombocytopenic purpura: Secondary | ICD-10-CM | POA: Diagnosis not present

## 2020-08-25 DIAGNOSIS — I10 Essential (primary) hypertension: Secondary | ICD-10-CM | POA: Diagnosis not present

## 2020-08-29 ENCOUNTER — Emergency Department (HOSPITAL_COMMUNITY): Payer: Medicare HMO

## 2020-08-29 ENCOUNTER — Other Ambulatory Visit: Payer: Self-pay

## 2020-08-29 ENCOUNTER — Observation Stay (HOSPITAL_COMMUNITY)
Admission: EM | Admit: 2020-08-29 | Discharge: 2020-08-31 | Disposition: A | Discharge status: Home / Self Care | Payer: Medicare HMO | Attending: Internal Medicine | Admitting: Internal Medicine

## 2020-08-29 ENCOUNTER — Encounter (HOSPITAL_COMMUNITY): Payer: Self-pay | Admitting: Family Medicine

## 2020-08-29 DIAGNOSIS — R4182 Altered mental status, unspecified: Secondary | ICD-10-CM | POA: Diagnosis not present

## 2020-08-29 DIAGNOSIS — D7589 Other specified diseases of blood and blood-forming organs: Secondary | ICD-10-CM | POA: Diagnosis not present

## 2020-08-29 DIAGNOSIS — R0689 Other abnormalities of breathing: Secondary | ICD-10-CM | POA: Diagnosis not present

## 2020-08-29 DIAGNOSIS — Z7984 Long term (current) use of oral hypoglycemic drugs: Secondary | ICD-10-CM | POA: Insufficient documentation

## 2020-08-29 DIAGNOSIS — N179 Acute kidney failure, unspecified: Secondary | ICD-10-CM

## 2020-08-29 DIAGNOSIS — R778 Other specified abnormalities of plasma proteins: Secondary | ICD-10-CM | POA: Diagnosis not present

## 2020-08-29 DIAGNOSIS — M549 Dorsalgia, unspecified: Secondary | ICD-10-CM | POA: Insufficient documentation

## 2020-08-29 DIAGNOSIS — R2681 Unsteadiness on feet: Secondary | ICD-10-CM | POA: Insufficient documentation

## 2020-08-29 DIAGNOSIS — I517 Cardiomegaly: Secondary | ICD-10-CM | POA: Diagnosis not present

## 2020-08-29 DIAGNOSIS — D72829 Elevated white blood cell count, unspecified: Secondary | ICD-10-CM | POA: Insufficient documentation

## 2020-08-29 DIAGNOSIS — I5022 Chronic systolic (congestive) heart failure: Secondary | ICD-10-CM | POA: Diagnosis not present

## 2020-08-29 DIAGNOSIS — I6782 Cerebral ischemia: Secondary | ICD-10-CM | POA: Diagnosis not present

## 2020-08-29 DIAGNOSIS — G8929 Other chronic pain: Secondary | ICD-10-CM | POA: Insufficient documentation

## 2020-08-29 DIAGNOSIS — R55 Syncope and collapse: Secondary | ICD-10-CM

## 2020-08-29 DIAGNOSIS — R0902 Hypoxemia: Secondary | ICD-10-CM | POA: Diagnosis not present

## 2020-08-29 DIAGNOSIS — I130 Hypertensive heart and chronic kidney disease with heart failure and stage 1 through stage 4 chronic kidney disease, or unspecified chronic kidney disease: Secondary | ICD-10-CM | POA: Diagnosis not present

## 2020-08-29 DIAGNOSIS — G9341 Metabolic encephalopathy: Secondary | ICD-10-CM | POA: Diagnosis not present

## 2020-08-29 DIAGNOSIS — E039 Hypothyroidism, unspecified: Secondary | ICD-10-CM | POA: Insufficient documentation

## 2020-08-29 DIAGNOSIS — G9340 Encephalopathy, unspecified: Secondary | ICD-10-CM

## 2020-08-29 DIAGNOSIS — I499 Cardiac arrhythmia, unspecified: Secondary | ICD-10-CM | POA: Diagnosis not present

## 2020-08-29 DIAGNOSIS — R079 Chest pain, unspecified: Principal | ICD-10-CM

## 2020-08-29 DIAGNOSIS — E1122 Type 2 diabetes mellitus with diabetic chronic kidney disease: Secondary | ICD-10-CM | POA: Insufficient documentation

## 2020-08-29 DIAGNOSIS — G319 Degenerative disease of nervous system, unspecified: Secondary | ICD-10-CM | POA: Diagnosis not present

## 2020-08-29 DIAGNOSIS — N1832 Chronic kidney disease, stage 3b: Secondary | ICD-10-CM | POA: Insufficient documentation

## 2020-08-29 DIAGNOSIS — I480 Paroxysmal atrial fibrillation: Secondary | ICD-10-CM | POA: Diagnosis not present

## 2020-08-29 DIAGNOSIS — R200 Anesthesia of skin: Secondary | ICD-10-CM | POA: Diagnosis not present

## 2020-08-29 DIAGNOSIS — Z20822 Contact with and (suspected) exposure to covid-19: Secondary | ICD-10-CM | POA: Diagnosis not present

## 2020-08-29 DIAGNOSIS — R4020 Unspecified coma: Secondary | ICD-10-CM | POA: Diagnosis not present

## 2020-08-29 DIAGNOSIS — R410 Disorientation, unspecified: Secondary | ICD-10-CM | POA: Diagnosis not present

## 2020-08-29 DIAGNOSIS — I213 ST elevation (STEMI) myocardial infarction of unspecified site: Secondary | ICD-10-CM | POA: Diagnosis not present

## 2020-08-29 LAB — CBC WITH DIFFERENTIAL/PLATELET
Abs Immature Granulocytes: 0.03 10*3/uL (ref 0.00–0.07)
Basophils Absolute: 0 10*3/uL (ref 0.0–0.1)
Basophils Relative: 0 %
Eosinophils Absolute: 0 10*3/uL (ref 0.0–0.5)
Eosinophils Relative: 0 %
HCT: 44.5 % (ref 39.0–52.0)
Hemoglobin: 14.9 g/dL (ref 13.0–17.0)
Immature Granulocytes: 0 %
Lymphocytes Relative: 12 %
Lymphs Abs: 1.3 10*3/uL (ref 0.7–4.0)
MCH: 33.8 pg (ref 26.0–34.0)
MCHC: 33.5 g/dL (ref 30.0–36.0)
MCV: 100.9 fL — ABNORMAL HIGH (ref 80.0–100.0)
Monocytes Absolute: 0.5 10*3/uL (ref 0.1–1.0)
Monocytes Relative: 5 %
Neutro Abs: 8.5 10*3/uL — ABNORMAL HIGH (ref 1.7–7.7)
Neutrophils Relative %: 83 %
Platelets: 188 10*3/uL (ref 150–400)
RBC: 4.41 MIL/uL (ref 4.22–5.81)
RDW: 13.7 % (ref 11.5–15.5)
WBC: 10.3 10*3/uL (ref 4.0–10.5)
nRBC: 0 % (ref 0.0–0.2)

## 2020-08-29 LAB — URINALYSIS, ROUTINE W REFLEX MICROSCOPIC
Bacteria, UA: NONE SEEN
Bilirubin Urine: NEGATIVE
Glucose, UA: NEGATIVE mg/dL
Hgb urine dipstick: NEGATIVE
Ketones, ur: 5 mg/dL — AB
Leukocytes,Ua: NEGATIVE
Nitrite: NEGATIVE
Protein, ur: >=300 mg/dL — AB
Specific Gravity, Urine: 1.016 (ref 1.005–1.030)
pH: 6 (ref 5.0–8.0)

## 2020-08-29 LAB — COMPREHENSIVE METABOLIC PANEL
ALT: 12 U/L (ref 0–44)
AST: 23 U/L (ref 15–41)
Albumin: 3.5 g/dL (ref 3.5–5.0)
Alkaline Phosphatase: 56 U/L (ref 38–126)
Anion gap: 13 (ref 5–15)
BUN: 20 mg/dL (ref 8–23)
CO2: 19 mmol/L — ABNORMAL LOW (ref 22–32)
Calcium: 9 mg/dL (ref 8.9–10.3)
Chloride: 107 mmol/L (ref 98–111)
Creatinine, Ser: 1.77 mg/dL — ABNORMAL HIGH (ref 0.61–1.24)
GFR, Estimated: 39 mL/min — ABNORMAL LOW (ref >60)
Glucose, Bld: 174 mg/dL — ABNORMAL HIGH (ref 70–99)
Potassium: 4.5 mmol/L (ref 3.5–5.1)
Sodium: 139 mmol/L (ref 135–145)
Total Bilirubin: 1.4 mg/dL — ABNORMAL HIGH (ref 0.3–1.2)
Total Protein: 6.6 g/dL (ref 6.5–8.1)

## 2020-08-29 LAB — CBC
HCT: 41.6 % (ref 39.0–52.0)
Hemoglobin: 13.7 g/dL (ref 13.0–17.0)
MCH: 33.6 pg (ref 26.0–34.0)
MCHC: 32.9 g/dL (ref 30.0–36.0)
MCV: 102 fL — ABNORMAL HIGH (ref 80.0–100.0)
Platelets: 185 10*3/uL (ref 150–400)
RBC: 4.08 MIL/uL — ABNORMAL LOW (ref 4.22–5.81)
RDW: 13.8 % (ref 11.5–15.5)
WBC: 11.9 10*3/uL — ABNORMAL HIGH (ref 4.0–10.5)
nRBC: 0 % (ref 0.0–0.2)

## 2020-08-29 LAB — I-STAT CHEM 8, ED
BUN: 21 mg/dL (ref 8–23)
Calcium, Ion: 1.13 mmol/L — ABNORMAL LOW (ref 1.15–1.40)
Chloride: 106 mmol/L (ref 98–111)
Creatinine, Ser: 1.6 mg/dL — ABNORMAL HIGH (ref 0.61–1.24)
Glucose, Bld: 152 mg/dL — ABNORMAL HIGH (ref 70–99)
HCT: 42 % (ref 39.0–52.0)
Hemoglobin: 14.3 g/dL (ref 13.0–17.0)
Potassium: 4.4 mmol/L (ref 3.5–5.1)
Sodium: 141 mmol/L (ref 135–145)
TCO2: 23 mmol/L (ref 22–32)

## 2020-08-29 LAB — CREATININE, SERUM
Creatinine, Ser: 1.78 mg/dL — ABNORMAL HIGH (ref 0.61–1.24)
GFR, Estimated: 39 mL/min — ABNORMAL LOW (ref >60)

## 2020-08-29 LAB — SARS CORONAVIRUS 2 BY RT PCR (HOSPITAL ORDER, PERFORMED IN ~~LOC~~ HOSPITAL LAB): SARS Coronavirus 2: NEGATIVE

## 2020-08-29 LAB — ETHANOL: Alcohol, Ethyl (B): <10 mg/dL (ref <10)

## 2020-08-29 LAB — TROPONIN I (HIGH SENSITIVITY)
Troponin I (High Sensitivity): 41 ng/L — ABNORMAL HIGH (ref <18)
Troponin I (High Sensitivity): 39 ng/L — ABNORMAL HIGH (ref <18)
Troponin I (High Sensitivity): 53 ng/L — ABNORMAL HIGH (ref <18)
Troponin I (High Sensitivity): 44 ng/L — ABNORMAL HIGH (ref <18)

## 2020-08-29 LAB — TYPE AND SCREEN
ABO/RH(D): A POS
Antibody Screen: NEGATIVE

## 2020-08-29 LAB — AMMONIA: Ammonia: 39 umol/L — ABNORMAL HIGH (ref 9–35)

## 2020-08-29 LAB — LACTIC ACID, PLASMA: Lactic Acid, Venous: 1.5 mmol/L (ref 0.5–1.9)

## 2020-08-29 LAB — PROTIME-INR
INR: 1.1 (ref 0.8–1.2)
Prothrombin Time: 14.2 seconds (ref 11.4–15.2)

## 2020-08-29 MED ORDER — ONDANSETRON HCL 4 MG/2ML IJ SOLN: 4.0000 mg | Freq: Four times a day (QID) | INTRAMUSCULAR | Status: DC | PRN

## 2020-08-29 MED ORDER — ACETAMINOPHEN 650 MG RE SUPP: 650.0000 mg | Freq: Four times a day (QID) | RECTAL | Status: DC | PRN

## 2020-08-29 MED ORDER — SODIUM CHLORIDE 0.9% FLUSH
3.0000 mL | Freq: Two times a day (BID) | INTRAVENOUS | Status: DC
  Administered 2020-08-29 – 2020-08-30 (×2): 3 mL via INTRAVENOUS

## 2020-08-29 MED ORDER — ONDANSETRON HCL 4 MG PO TABS: 4.0000 mg | ORAL_TABLET | Freq: Four times a day (QID) | ORAL | Status: DC | PRN

## 2020-08-29 MED ORDER — LACTATED RINGERS IV SOLN: INTRAVENOUS | Status: DC

## 2020-08-29 MED ORDER — ENOXAPARIN SODIUM 40 MG/0.4ML ~~LOC~~ SOLN
40.0000 mg | SUBCUTANEOUS | Status: DC
  Administered 2020-08-29: 40 mg via SUBCUTANEOUS
  Filled 2020-08-29: qty 0.4

## 2020-08-29 MED ORDER — ACETAMINOPHEN 325 MG PO TABS: 650.0000 mg | ORAL_TABLET | Freq: Four times a day (QID) | ORAL | Status: DC | PRN

## 2020-08-29 NOTE — ED Notes (Signed)
Pt taken to MRI  

## 2020-08-29 NOTE — ED Provider Notes (Signed)
3:43 PM Care assumed from Dr. Francia Greaves.  At time of transfer of care, patient is awaiting for admission after results of CT head and delta troponin.  Patient reportedly had chest pain at 6 AM and was found unresponsive by wife.  Patient also was having some chest discomfort and has initially elevated troponin 41, it will be trended.  Patient was reportedly activated as a code STEMI in the field but was canceled by cardiology.  As there is no focal findings on his neurologic exam from previous team and he is slightly more awake now, they did not feel code stroke was appropriate.  Anticipate admission after work-up is completed.  Work-up shows his troponin is downtrending from 41-39.  Patient does have a elevated creatinine of 1.77.  CT head returned showing no acute stroke.  Due to the unresponsive episode/syncope, elevated troponin, associated chest pain, and elevated creatinine, will call for admission for further management.  Patient admitted for further management.  Clinical Impression: 1. Altered mental status, unspecified altered mental status type   2. Chest pain, unspecified type   3. Elevated troponin     Disposition: Admit  This note was prepared with assistance of Dragon voice recognition software. Occasional wrong-word or sound-a-like substitutions may have occurred due to the inherent limitations of voice recognition software.      Tegeler, Gwenyth Allegra, MD 08/29/20 2352

## 2020-08-29 NOTE — ED Provider Notes (Signed)
[facility]GENC[facility] DEPARTMENT Provider Note   CSN: [id number] Arrival date & time: 08/29/20  1332     History Chief Complaint  Patient presents with  . Chest Pain    Morrow Fiss Orozco is a 80 y.o. male.  80 year old male with prior medical history as detailed below presents for evaluation.  Patient reportedly awoke this morning at home.  Patient reportedly went back to sleep after getting up this morning.  Patient apparently was difficult to arouse after this morning nap.  Patient's wife called EMS after she could not wake the patient.  Upon arrival to the ED, the patient is alert.  He complains of " not feeling right."  EMS reports that the patient was complaining of chest pain.  Patient denies active chest pain at rest time.  Patient reports that he drinks vodka nightly prior to going to sleep.   The history is provided by the patient and medical records.  Loss of Consciousness Episode history:  Single Most recent episode:  Today Duration:  3 hours Timing:  Constant Progression:  Waxing and waning Chronicity:  New Relieved by:  Nothing Worsened by:  Nothing      No past medical history on file.  There are no problems to display for this patient.     No family history on file.     Home Medications Prior to Admission medications   Not on File    Allergies    Patient has no known allergies.  Review of Systems   Review of Systems  Cardiovascular: Positive for syncope.  All other systems reviewed and are negative.   Physical Exam Updated Vital Signs BP (!) 186/96   Pulse 96   Temp 98.2 F (36.8 C) (Oral)   Resp 16   Ht '5\' 10"'$  (1.778 m)   Wt 67.1 kg   SpO2 99%   BMI 21.23 kg/m   Physical Exam Vitals and nursing note reviewed.  Constitutional:      General: He is not in acute distress.    Appearance: He is well-developed and well-nourished.  HENT:     Head: Normocephalic and atraumatic.     Mouth/Throat:     Mouth:  Oropharynx is clear and moist.  Eyes:     Extraocular Movements: EOM normal.     Conjunctiva/sclera: Conjunctivae normal.     Pupils: Pupils are equal, round, and reactive to light.  Cardiovascular:     Rate and Rhythm: Normal rate and regular rhythm.     Heart sounds: Normal heart sounds.  Pulmonary:     Effort: Pulmonary effort is normal. No respiratory distress.     Breath sounds: Normal breath sounds.  Abdominal:     General: There is no distension.     Palpations: Abdomen is soft.     Tenderness: There is no abdominal tenderness.  Musculoskeletal:        General: No deformity or edema. Normal range of motion.     Cervical back: Normal range of motion and neck supple.  Skin:    General: Skin is warm and dry.  Neurological:     General: No focal deficit present.     Mental Status: He is alert. He is disoriented.     Comments: Alert, oriented to person.   Normal speech.   No facial droop.  LVO screen negative.  5/5 strength to BUE/BLE.    Psychiatric:        Mood and Affect: Mood and affect normal.  ED Results / Procedures / Treatments   Labs (all labs ordered are listed, but only abnormal results are displayed) Labs Reviewed  COMPREHENSIVE METABOLIC PANEL - Abnormal; Notable for the following components:      Result Value   CO2 19 (*)    Glucose, Bld 174 (*)    Creatinine, Ser 1.77 (*)    Total Bilirubin 1.4 (*)    GFR, Estimated 39 (*)    All other components within normal limits  CBC WITH DIFFERENTIAL/PLATELET - Abnormal; Notable for the following components:   MCV 100.9 (*)    Neutro Abs 8.5 (*)    All other components within normal limits  TROPONIN I (HIGH SENSITIVITY) - Abnormal; Notable for the following components:   Troponin I (High Sensitivity) 41 (*)    All other components within normal limits  SARS CORONAVIRUS 2 BY RT PCR (HOSPITAL ORDER, [facility])  ETHANOL  PROTIME-INR  LACTIC ACID, PLASMA  LACTIC ACID,  PLASMA  URINALYSIS, ROUTINE W REFLEX MICROSCOPIC  AMMONIA  I-STAT CHEM 8, ED  CBG MONITORING, ED  TYPE AND SCREEN    EKG EKG Interpretation  Date/Time:  Friday August 29 2020 14:09:40 EST Ventricular Rate:  73 PR Interval:    QRS Duration: 126 QT Interval:  404 QTC Calculation: 446 R Axis:   -71 Text Interpretation: Unknown rhythm, irregular rate Right bundle branch block LVH with IVCD and secondary repol abnrm Confirmed by Dene Gentry [phone]) on 08/29/2020 2:11:26 PM   Radiology DG Chest Port 1 View  Result Date: 08/29/2020 CLINICAL DATA:  Unresponsive EXAM: PORTABLE CHEST 1 VIEW COMPARISON:  May 25, 2020 FINDINGS: The cardiomediastinal silhouette is unchanged and enlarged in contour.Incomplete assessment of the LEFT costophrenic angle. No pleural effusion. No pneumothorax. No acute pleuroparenchymal abnormality. Visualized abdomen is unremarkable. Degenerative changes of the acromioclavicular joints. Degenerative changes of the thoracolumbar spine. Vascular calcifications. IMPRESSION: Unchanged cardiomegaly. Electronically Signed   By: Valentino Saxon MD   On: 08/29/2020 14:42    Procedures Procedures   Medications Ordered in ED Medications - No data to display  ED Course  I have reviewed the triage vital signs and the nursing notes.  Pertinent labs & imaging results that were available during my care of the patient were reviewed by me and considered in my medical decision making (see chart for details).    MDM Rules/Calculators/A&P                          MDM  Screen complete  [facility] Orozco was evaluated in Emergency Department on 08/29/2020 for the symptoms described in the history of present illness. He was evaluated in the context of the global COVID-19 pandemic, which necessitated consideration that the patient might be at risk for infection with the SARS-CoV-2 virus that causes COVID-19. Institutional protocols and algorithms that pertain to the  evaluation of patients at risk for COVID-19 are in a state of rapid change based on information released by regulatory bodies including the CDC and federal and state organizations. These policies and algorithms were followed during the patient's care in the ED.  Patient is presenting with AMS.  Patient apparently was very difficult to arouse at home.  Upon arrival to the ED he is now alert but appears confused.  Patient did report chest pain prior to arrival.  Screening labs to be obtained.  CT imaging of the brain will be ordered.  Dr. Sherry Ruffing aware of pending  labs/CT/dispo.   Final Clinical Impression(s) / ED Diagnoses Final diagnoses:  Altered mental status, unspecified altered mental status type  Chest pain, unspecified type  Elevated troponin    Rx / DC Orders ED Discharge Orders    None       Valarie Merino, MD 09/01/20 [phone]

## 2020-08-29 NOTE — ED Notes (Signed)
Pt returned from CT °

## 2020-08-29 NOTE — ED Triage Notes (Signed)
Pt arrives by EMS. Pt had CP '@0600'$ . Pt was found unresponsive by wife. Pt alert and oriented on arrival to ED. Pt denies CP at this time; report chronic back pain. Pt also c/o generalized numbness.

## 2020-08-29 NOTE — H&P (Signed)
History and Physical    MAURI LEDON [id number] DOB: [birth year] DOA: 08/29/2020  PCP: Pcp, No   Patient coming from: Home  Chief Complaint: Syncope and unresponsive, complained of chest pain when regained Justice  HPI: [place] Orozco is a 80 y.o. male with no reported chronic medical conditions. Pt is not able to provide good history at this time. No family at bedside. Patient reportedly had a syncopal episode this am after he woke up.  He apparently was difficult to arouse  so his wife called EMS after she could not wake the patient.  Upon arrival to the ED, the patient is alert.  He complains of " not feeling right and feeling fuzzy all over."  EMS reports that the patient was complaining of chest pain.  Patient denies active chest pain at this time. He denies having abdominal pain, cough, SOB, fever, nausea, vomiting or dysuria. He is currently very emotional and crying stating he is dying. He is hemodynamically stable and troponin levels were 41 then 39 on repeat. He is found to have elevated creatinine level with no previous lab to compare too.  EMS activated him as a STEMI but cardiology reviewed his EKG and stated he was not having a STEMI did not need acute intervention.  Serial troponin was obtained in the emergency room and was stable.  ER physician discussed with cardiology stated patient had a recent echocardiogram with a good EF and cardiology stated they would consult if patient had recurrent chest pain or elevation of troponin levels  Review of Systems:  General: Denies weakness, fever, chills, weight loss, night sweats.  Denies dizziness.  Denies change in appetite HENT: Denies head trauma, headache, denies change in hearing, tinnitus.  Denies nasal congestion or bleeding.  Denies sore throat, sores in mouth.  Denies difficulty swallowing Eyes: Denies blurry vision, pain in eye, drainage.  Denies discoloration of eyes. Neck: Denies pain.  Denies swelling.  Denies pain with  movement. Cardiovascular: Reports chest pain. Denies palpitations.  Denies edema.  Denies orthopnea Respiratory: Denies shortness of breath, cough.  Denies wheezing.  Denies sputum production Gastrointestinal: Denies abdominal pain, swelling.  Denies nausea, vomiting, diarrhea.  Denies melena.  Denies hematemesis. Musculoskeletal: Denies limitation of movement.  Denies deformity or swelling.  Denies pain.  Genitourinary: Denies pelvic pain.  Denies urinary frequency or hesitancy.  Denies dysuria.  Skin: Denies rash.  Denies petechiae, purpura, ecchymosis. Neurological: Reports syncope this am. Denies headache.  Denies seizure activity.  Denies paresthesia.  Denies slurred speech, drooping face.  Denies visual change. Psychiatric: Denies depression, anxiety.  Denies hallucinations.  History reviewed. No pertinent past medical history.  History reviewed. No pertinent surgical history.  Social History  has no history on file for tobacco use, alcohol use, and drug use.  No Known Allergies  History reviewed. No pertinent family history.   Prior to Admission medications   Not on File    Physical Exam: Vitals:   08/29/20 1935 08/29/20 1945 08/29/20 2000 08/29/20 2030  BP: (!) 173/96 (!) 175/85 (!) 180/128 (!) 163/91  Pulse: 86 (!) 56 65 81  Resp: (!) 22  18 (!) 23  Temp:      TempSrc:      SpO2: 98% 99% 99% 100%  Weight:      Height:        Constitutional: NAD, calm, comfortable Vitals:   08/29/20 1935 08/29/20 1945 08/29/20 2000 08/29/20 2030  BP: (!) 173/96 (!) 175/85 (!) 180/128 (!) 163/91  Pulse: 86 (!) 56 65 81  Resp: (!) 22  18 (!) 23  Temp:      TempSrc:      SpO2: 98% 99% 99% 100%  Weight:      Height:       General: WDWN, Alert and oriented to self.  Eyes: EOMI, PERRL, conjunctivae normal.  Sclera nonicteric HENT:  [place]/AT, external ears normal.  Nares patent without epistasis.  Mucous membranes are moist. Posterior pharynx clear of any exudate or lesions Neck:  Soft, normal range of motion, supple, no masses, no thyromegaly.  Trachea midline Respiratory: clear to auscultation bilaterally, no wheezing, no crackles. Normal respiratory effort. No accessory muscle use.  Cardiovascular: Irregular rhythm with normal rate, no murmurs / rubs / gallops. No extremity edema. 2+ pedal pulses. No carotid bruits.  Abdomen: Soft, no tenderness, nondistended, no rebound or guarding.  No masses palpated. No hepatosplenomegaly. Bowel sounds normoactive Musculoskeletal: FROM. no cyanosis. No joint deformity Reaver and lower extremities. Normal muscle tone.  Skin: Warm, dry, intact no rashes, lesions, ulcers. No induration Neurologic: CN 2-12 grossly intact.  Normal speech.  Sensation intact, patella DTR +1 bilaterally. Strength 5/5 in all extremities.   Psychiatric: Tearful with labile emotions   Labs on Admission: I have personally reviewed following labs and imaging studies  CBC: Recent Labs  Lab 08/29/20 1400 08/29/20 1623  WBC 10.3  --   NEUTROABS 8.5*  --   HGB 14.9 14.3  HCT 44.5 42.0  MCV 100.9*  --   PLT 188  --     Basic Metabolic Panel: Recent Labs  Lab 08/29/20 1400 08/29/20 1623  NA 139 141  K 4.5 4.4  CL 107 106  CO2 19*  --   GLUCOSE 174* 152*  BUN 20 21  CREATININE 1.77* 1.60*  CALCIUM 9.0  --     GFR: Estimated Creatinine Clearance: 36.1 mL/min (A) (by C-G formula based on SCr of 1.6 mg/dL (H)).  Liver Function Tests: Recent Labs  Lab 08/29/20 1400  AST 23  ALT 12  ALKPHOS 56  BILITOT 1.4*  PROT 6.6  ALBUMIN 3.5    Urine analysis: No results found for: COLORURINE, APPEARANCEUR, LABSPEC, PHURINE, GLUCOSEU, HGBUR, BILIRUBINUR, KETONESUR, PROTEINUR, UROBILINOGEN, NITRITE, LEUKOCYTESUR  Radiological Exams on Admission: CT Head Wo Contrast  Result Date: 08/29/2020 CLINICAL DATA:  Delirium. Additional history provided: Patient found unresponsive by wife, alert and oriented upon arrival to ED, patient reports generalized  numbness. EXAM: CT HEAD WITHOUT CONTRAST TECHNIQUE: Contiguous axial images were obtained from the base of the skull through the vertex without intravenous contrast. COMPARISON:  Head CT 05/25/2020. FINDINGS: Brain: Moderate cerebral and cerebellar atrophy. Moderately advanced ill-defined hypoattenuation within the cerebral white matter is nonspecific, but compatible with chronic small vessel ischemic disease. There is no acute intracranial hemorrhage. No demarcated cortical infarct. No extra-axial fluid collection. No evidence of intracranial mass. No midline shift. Vascular: No hyperdense vessel.  Atherosclerotic calcifications. Skull: Normal. Negative for fracture or focal lesion. Sinuses/Orbits: Visualized orbits show no acute finding. Mild scattered paranasal sinus mucosal thickening. IMPRESSION: No evidence of acute intracranial abnormality. Moderate generalized atrophy of the brain with moderately advanced cerebral white matter chronic small vessel ischemic disease, stable as compared to the head CT of 05/25/2020. Mild paranasal sinus mucosal thickening. Electronically Signed   By: Kellie Simmering DO   On: 08/29/2020 15:53   DG Chest Port 1 View  Result Date: 08/29/2020 CLINICAL DATA:  Unresponsive EXAM: PORTABLE CHEST 1 VIEW COMPARISON:  May 25, 2020 FINDINGS: The cardiomediastinal silhouette is unchanged and enlarged in contour.Incomplete assessment of the LEFT costophrenic angle. No pleural effusion. No pneumothorax. No acute pleuroparenchymal abnormality. Visualized abdomen is unremarkable. Degenerative changes of the acromioclavicular joints. Degenerative changes of the thoracolumbar spine. Vascular calcifications. IMPRESSION: Unchanged cardiomegaly. Electronically Signed   By: Valentino Saxon MD   On: 08/29/2020 14:42    EKG: Independently reviewed.  EKG shows a regular rhythm with right bundle branch block and LVH by voltage criteria.  Nonspecific ST changes without significant elevation. QTc  446  Assessment/Plan Principal Problem:   Chest pain Patient replaced on cardiac telemetry for observation for chest pain.  Obtain serial troponin levels.  If troponins become positive will consult cardiology.  ER physician discussed with cardiology who stated pt was not having STEMI as was activated by EMS. Stated pt had recent echo with cardiology with good EF. Cardiology will be available for consult if chest pain recurs or troponin elevates. Antiplatelet therapy with aspirin daily.  Check lipid panel.  Continue statin therapy.  Monitor blood pressure.  Nitroglycerin as needed.  Pulmonal oxygen as needed to maintain O2 sat between 92-96%  Active Problems:   AKI (acute kidney injury)  IV fluid hydration with LR at 100 ml/hr overnight.  Recheck electrolytes and renal function in morning with labs    Syncope With syncopal episode and was unresponsive initially per report.  CT of the head is negative for acute pathology.    Encephalopathy acute Patient with confusion and very labile emotions with disjointed speech.  Obtain MRI for further evaluation with acute encephalopathy in light of syncopal episode make sure no acute stroke occurred as etiology   DVT prophylaxis: Lovenox for DVT prophylaxis Code Status:   Full code Family Communication:  No family at bedside Disposition Plan:   Patient is from:  Home  Anticipated DC to:  Home  Anticipated DC date:  Anticipate less than two midnight stay  Anticipated DC barriers: No barriers to discharge identified at this time  Admission status:  Observation   Eben Burow MD [facility]  How to contact the [facility] Attending or Consulting provider [place] or covering provider during after hours [place], for this patient?   1. Check the care team in [facility] and look for a) attending/consulting [facility] provider listed and b) the [facility] team listed 2. Log into [email or web address] and use [place]'s universal password to access. If you do not have the  password, please contact the hospital operator. 3. Locate the [facility] provider you are looking for under [facility] and page to a number that you can be directly reached. 4. If you still have difficulty reaching the provider, please page the [facility] (Director on Call) for the Hospitalists listed on amion for assistance.  08/29/2020, 8:44 PM

## 2020-08-30 ENCOUNTER — Observation Stay (HOSPITAL_COMMUNITY): Payer: Medicare HMO

## 2020-08-30 ENCOUNTER — Other Ambulatory Visit: Payer: Self-pay

## 2020-08-30 DIAGNOSIS — I6782 Cerebral ischemia: Secondary | ICD-10-CM | POA: Diagnosis not present

## 2020-08-30 DIAGNOSIS — G319 Degenerative disease of nervous system, unspecified: Secondary | ICD-10-CM | POA: Diagnosis not present

## 2020-08-30 DIAGNOSIS — G9340 Encephalopathy, unspecified: Secondary | ICD-10-CM | POA: Diagnosis not present

## 2020-08-30 DIAGNOSIS — R55 Syncope and collapse: Secondary | ICD-10-CM | POA: Diagnosis not present

## 2020-08-30 DIAGNOSIS — N179 Acute kidney failure, unspecified: Secondary | ICD-10-CM | POA: Diagnosis not present

## 2020-08-30 DIAGNOSIS — R410 Disorientation, unspecified: Secondary | ICD-10-CM | POA: Diagnosis not present

## 2020-08-30 DIAGNOSIS — R079 Chest pain, unspecified: Secondary | ICD-10-CM | POA: Diagnosis not present

## 2020-08-30 LAB — CBC
HCT: 39.9 % (ref 39.0–52.0)
Hemoglobin: 13.6 g/dL (ref 13.0–17.0)
MCH: 34.7 pg — ABNORMAL HIGH (ref 26.0–34.0)
MCHC: 34.1 g/dL (ref 30.0–36.0)
MCV: 101.8 fL — ABNORMAL HIGH (ref 80.0–100.0)
Platelets: 165 10*3/uL (ref 150–400)
RBC: 3.92 MIL/uL — ABNORMAL LOW (ref 4.22–5.81)
RDW: 14 % (ref 11.5–15.5)
WBC: 11.5 10*3/uL — ABNORMAL HIGH (ref 4.0–10.5)
nRBC: 0 % (ref 0.0–0.2)

## 2020-08-30 LAB — RAPID URINE DRUG SCREEN, HOSP PERFORMED
Amphetamines: NOT DETECTED
Barbiturates: NOT DETECTED
Benzodiazepines: NOT DETECTED
Cocaine: NOT DETECTED
Opiates: POSITIVE — AB
Tetrahydrocannabinol: NOT DETECTED

## 2020-08-30 LAB — BASIC METABOLIC PANEL
Anion gap: 11 (ref 5–15)
BUN: 20 mg/dL (ref 8–23)
CO2: 21 mmol/L — ABNORMAL LOW (ref 22–32)
Calcium: 8.8 mg/dL — ABNORMAL LOW (ref 8.9–10.3)
Chloride: 108 mmol/L (ref 98–111)
Creatinine, Ser: 1.75 mg/dL — ABNORMAL HIGH (ref 0.61–1.24)
GFR, Estimated: 39 mL/min — ABNORMAL LOW (ref >60)
Glucose, Bld: 163 mg/dL — ABNORMAL HIGH (ref 70–99)
Potassium: 4.5 mmol/L (ref 3.5–5.1)
Sodium: 140 mmol/L (ref 135–145)

## 2020-08-30 LAB — LACTIC ACID, PLASMA: Lactic Acid, Venous: 1.5 mmol/L (ref 0.5–1.9)

## 2020-08-30 LAB — VITAMIN B12: Vitamin B-12: 312 pg/mL (ref 180–914)

## 2020-08-30 LAB — TSH: TSH: 12.491 u[IU]/mL — ABNORMAL HIGH (ref 0.350–4.500)

## 2020-08-30 LAB — GLUCOSE, CAPILLARY
Glucose-Capillary: 171 mg/dL — ABNORMAL HIGH (ref 70–99)
Glucose-Capillary: 180 mg/dL — ABNORMAL HIGH (ref 70–99)

## 2020-08-30 LAB — AMMONIA: Ammonia: 12 umol/L (ref 9–35)

## 2020-08-30 LAB — HEMOGLOBIN A1C
Hgb A1c MFr Bld: 6.1 % — ABNORMAL HIGH (ref 4.8–5.6)
Mean Plasma Glucose: 128.37 mg/dL

## 2020-08-30 LAB — ABO/RH: ABO/RH(D): A POS

## 2020-08-30 LAB — CBG MONITORING, ED: Glucose-Capillary: 127 mg/dL — ABNORMAL HIGH (ref 70–99)

## 2020-08-30 LAB — FOLATE: Folate: 7.2 ng/mL (ref >5.9)

## 2020-08-30 MED ORDER — LORAZEPAM 2 MG/ML IJ SOLN
1.0000 mg | Freq: Once | INTRAMUSCULAR | Status: AC
  Administered 2020-08-30: 1 mg via INTRAVENOUS
  Filled 2020-08-30: qty 1

## 2020-08-30 MED ORDER — LEVOTHYROXINE SODIUM 25 MCG PO TABS
25.0000 ug | ORAL_TABLET | Freq: Every day | ORAL | Status: DC
  Administered 2020-08-31: 25 ug via ORAL
  Filled 2020-08-30: qty 1

## 2020-08-30 MED ORDER — HYDROMORPHONE HCL 1 MG/ML IJ SOLN
0.5000 mg | INTRAMUSCULAR | Status: DC | PRN
  Administered 2020-08-30 – 2020-08-31 (×2): 0.5 mg via INTRAVENOUS
  Filled 2020-08-30: qty 0.5
  Filled 2020-08-30: qty 1

## 2020-08-30 MED ORDER — HYDROMORPHONE HCL 1 MG/ML IJ SOLN
0.5000 mg | Freq: Once | INTRAMUSCULAR | Status: AC
  Administered 2020-08-30: 0.5 mg via INTRAVENOUS
  Filled 2020-08-30: qty 1

## 2020-08-30 MED ORDER — NITROGLYCERIN 0.4 MG SL SUBL: 0.4000 mg | SUBLINGUAL_TABLET | SUBLINGUAL | Status: DC | PRN

## 2020-08-30 MED ORDER — INSULIN ASPART 100 UNIT/ML ~~LOC~~ SOLN: 0.0000 [IU] | Freq: Every day | SUBCUTANEOUS | Status: DC

## 2020-08-30 MED ORDER — OXYCODONE HCL 5 MG PO TABS
10.0000 mg | ORAL_TABLET | ORAL | Status: DC | PRN
  Administered 2020-08-31: 10 mg via ORAL
  Filled 2020-08-30: qty 2

## 2020-08-30 MED ORDER — METOPROLOL TARTRATE 25 MG PO TABS
25.0000 mg | ORAL_TABLET | Freq: Two times a day (BID) | ORAL | Status: DC
  Administered 2020-08-30 – 2020-08-31 (×3): 25 mg via ORAL
  Filled 2020-08-30 (×3): qty 1

## 2020-08-30 MED ORDER — LORAZEPAM 2 MG/ML IJ SOLN: 0.5000 mg | Freq: Once | INTRAMUSCULAR | Status: DC

## 2020-08-30 MED ORDER — ASPIRIN EC 81 MG PO TBEC
81.0000 mg | DELAYED_RELEASE_TABLET | Freq: Every day | ORAL | Status: DC
  Administered 2020-08-30 – 2020-08-31 (×2): 81 mg via ORAL
  Filled 2020-08-30 (×2): qty 1

## 2020-08-30 MED ORDER — APIXABAN 5 MG PO TABS
5.0000 mg | ORAL_TABLET | Freq: Two times a day (BID) | ORAL | Status: DC
  Administered 2020-08-30 – 2020-08-31 (×2): 5 mg via ORAL
  Filled 2020-08-30: qty 1
  Filled 2020-08-30: qty 2

## 2020-08-30 MED ORDER — INSULIN ASPART 100 UNIT/ML ~~LOC~~ SOLN
0.0000 [IU] | Freq: Three times a day (TID) | SUBCUTANEOUS | Status: DC
  Administered 2020-08-30: 3 [IU] via SUBCUTANEOUS
  Administered 2020-08-31: 5 [IU] via SUBCUTANEOUS

## 2020-08-30 NOTE — Evaluation (Signed)
Physical Therapy Evaluation Patient Details Name: Austin Orozco MRN: [id number] DOB: 1941-04-04 Today's Date: 08/30/2020   History of Present Illness  80 year old male with past medical history of A. fib-on Eliquis, CKD stage IIIb with (baseline creatinine of 2 and GFR in 40s), hypothyroidism, chronic back pain, hypertension, diabetes mellitus presents to the emergency department for evaluation of chest pain, syncope and confusion.  Clinical Impression  Patient poor historian and information unreliable due to AMS. Patient min guard for bed mobility and modA for sit to stand transfer with RW. Patient ambulated 3' fwd/3' bwd with RW and minA, ambulation distance limited by fatigue. Patient presents with decreased activity tolerance, impaired balance, generalized weakness, impaired cognition, and impaired functional mobility.     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 Sit to stand x2. Cues for hand placement  Ambulation/Gait Ambulation/Gait assistance: Min assist Gait Distance (Feet): 6 Feet Assistive device: Rolling Clovia Reine (2 wheeled) Gait Pattern/deviations: Step-to pattern;Decreased stride length;Trunk flexed;Wide base of support Gait velocity: decreased   General Gait  Details: 3' fwd/3'bwd. Cues for upright posture and RW management. Assist required for RW management as patient tends to push too far out in front of him.  Stairs            Wheelchair Mobility    Modified Rankin (Stroke Patients Only)       Balance Overall balance assessment: Needs assistance Sitting-balance support: No Pong extremity supported;Feet supported Sitting balance-Leahy Scale: Fair     Standing balance support: Bilateral Miah extremity supported;During functional activity Standing balance-Leahy Scale: Poor Standing balance comment: reliant on UE support and external assist                             Pertinent Vitals/Pain Pain Assessment: Faces Faces Pain Scale: Hurts little more Pain Location: back Pain Descriptors / Indicators: Grimacing;Moaning Pain Intervention(s): Monitored during session;Repositioned    Home Living Family/patient expects to be discharged to:: Private residence Living Arrangements: Spouse/significant other Available Help at Discharge: Family;Available 24 hours/day Type of Home: House Home Access: Stairs to enter Entrance Stairs-Rails: Right Entrance Stairs-Number of Steps: 8 Home Layout: One level Home Equipment: Environmental consultant - 2 wheels Additional Comments: Unsure if reliable as patient with AMS    Prior Function Level of Independence: Independent         Comments: patient reports difficulty bending over to don pants     Hand Dominance        Extremity/Trunk Assessment   [place] Extremity Assessment Chap Extremity Assessment: Defer to OT evaluation    Lower Extremity Assessment Lower Extremity Assessment: Generalized weakness    Cervical / Trunk Assessment Cervical / Trunk Assessment: Kyphotic  Communication   Communication: No difficulties  Cognition Arousal/Alertness:  Awake/alert Behavior During Therapy: WFL for tasks assessed/performed Overall Cognitive Status: Impaired/Different from baseline Area  of Impairment: Orientation;Attention;Memory;Following commands;Safety/judgement;Problem solving                 Orientation Level: Disoriented to;Place Current Attention Level: Selective Memory: Decreased short-term memory Following Commands: Follows one step commands inconsistently;Follows one step commands with increased time Safety/Judgement: Decreased awareness of safety;Decreased awareness of deficits   Problem Solving: Slow processing;Difficulty sequencing;Requires verbal cues;Requires tactile cues        General Comments      Exercises     Assessment/Plan    PT Assessment Patient needs continued PT services  PT Problem List Decreased strength;Decreased activity tolerance;Decreased balance;Decreased mobility;Decreased cognition;Decreased safety awareness;Decreased knowledge of use of DME       PT Treatment Interventions DME instruction;Gait training;Stair training;Functional mobility training;Therapeutic activities;Therapeutic exercise;Balance training;Cognitive remediation;Patient/family education    PT Goals (Current goals can be found in the Care Plan section)  Acute Rehab PT Goals Patient Stated Goal: did not state PT Goal Formulation: Patient unable to participate in goal setting Time For Goal Achievement: 09/13/20 Potential to Achieve Goals: Fair    Frequency Min 3X/week   Barriers to discharge        Co-evaluation               AM-PAC PT "6 Clicks" Mobility  Outcome Measure Help needed turning from your back to your side while in a flat bed without using bedrails?: A Little Help needed moving from lying on your back to sitting on the side of a flat bed without using bedrails?: A Little Help needed moving to and from a bed to a chair (including a wheelchair)?: A Little Help needed standing up from a chair using your arms (e.g., wheelchair or bedside chair)?: A Lot Help needed to walk in hospital room?: A Little Help needed climbing 3-5 steps  with a railing? : A Lot 6 Click Score: 16    End of Session Equipment Utilized During Treatment: Gait belt Activity Tolerance: Patient limited by fatigue Patient left: in bed;with bed alarm set;with call bell/phone within reach Nurse Communication: Mobility status PT Visit Diagnosis: Muscle weakness (generalized) (M62.81);Unsteadiness on feet (R26.81);Other abnormalities of gait and mobility (R26.89)    Time: [id number] PT Time Calculation (min) (ACUTE ONLY): 27 min   Charges:   PT Evaluation $PT Eval Moderate Complexity: 1 Mod PT Treatments $Therapeutic Activity: 8-22 mins        Marcanthony Sleight A. Gilford Rile PT, DPT Acute Rehabilitation Services Pager [phone] Office [phone]   Alda Lea 08/30/2020, 3:39 PM

## 2020-08-30 NOTE — ED Notes (Signed)
Received phone call from MRI that pt was uncomfortable with increased pain while in the scanner. Verbal orders obtained for '1mg'$  ativan. When RN arrived to MRI, MRI tech reported he had enough scans and pt was moved back to bed. Pt screaming in pain to mid/Mastrianni back. Ok to given ativan for pain per Dr.Chotiner.

## 2020-08-30 NOTE — Progress Notes (Addendum)
PROGRESS NOTE    Austin Orozco  [id number] DOB: [birth year] DOA: 08/29/2020 PCP: Pcp, No   Brief Narrative:  Patient is 80 year old male with past medical history of A. fib-on Eliquis, CKD stage IIIb with (baseline creatinine of 2 and GFR in 40s), hypothyroidism, chronic back pain, hypertension, diabetes mellitus presents to the emergency department for evaluation of chest pain, syncope and confusion.  Upon arrival to ED: Patient's vital signs stable, troponin: 41 then 39, BMP shows AKI on CKD, leukocytosis of 11.5,.  EMS activated him as NSTEMI but cardiology reviewed his EKG and stated he was not having HDMI and do not need acute intervention.  Patient admitted under [facility] services for further evaluation and management of chest pain syncope and confusion.  Assessment & Plan:  Chest pain: -Troponin 41-39-53-44.  EKG: No acute ischemic changes. -Reviewed care everywhere record-he had echo done on 11/1 which showed ejection fraction of 40%, mild MR, mild AR/AS -ED: EKG reviewed by cardiology and does not believe that patient needs any acute intervention at this time. -Aspirin and nitro as needed for chest pain.   -Monitor vitals closely.  Acute metabolic encephalopathy/syncope: -Unknown underlying etiology.  MRI and CT head: Negative for acute findings.  UA negative for infection.  UDS positive for opioids.  Ethanol: WNL, ammonia level: WNL -Neurochecks.  Check B12 and folate. -Consult PT/OT/SLP -On fall/aspiration precautions  CKD stage IIIb: -Baseline creatinine: 2 and GFR: 40.  Creatinine on arrival: 1.7, GFR: 54 -New with gentle hydration and monitor renal function closely.  Hypertension: Blood pressure: Labile.  Continue metoprolol and monitor blood pressure closely  Chronic systolic CHF: Patient appears euvolemic on exam -Reviewed echo from care everywhere from 11/21 which showed ejection fraction of 40% -Strict INO's and daily weight and monitor signs of fluid  overload.  Paroxysmal A. Fib: -Reviewed care everywhere-patient was admitted in October 2021 with A. fib with RVR and was started on Eliquis and metoprolol. -Continue Eliquis and metoprolol for now.  Monitor closely on telemetry  Diabetes mellitus: We will check A1c. -Start patient on sliding scale insulin monitor blood sugar closely  Hypothyroidism: Check TSH -Continue levothyroxine  Chronic back pain: Continue as needed pain medication -Consult PT/OT  Macrocytosis: We will check B12 and folate level.  Leukocytosis: Likely reactive.  Chest x-ray negative for pneumonia, COVID-19 negative, UA negative for infection.  Repeat CBC tomorrow AM.  No indication of antibiotics at this time.  DVT prophylaxis: Eliquis\SCD Code Status: Full code-confirmed with patient's wife Family Communication:  None present at bedside.  Plan of care discussed with patient in length and he verbalized understanding and agreed with it.  I called patient's wife and daughter separately-discussed plan of care and they both verbalized understanding.  Disposition Plan: To be determined  Consultants:   None  Procedures:   None  Antimicrobials:   None  Status is: Observation   Dispo: The patient is from: Home              Anticipated d/c is to: Home              Anticipated d/c date is: 2 days              Patient currently is not medically stable to d/c.   Difficult to place patient No         Subjective: Patient seen and examined in ED.  Sleepy but arousable but not talking or following commands.  Objective: Vitals:   08/30/20 0700 08/30/20 0800  08/30/20 0830 08/30/20 0930  BP: (!) 144/73 (!) 166/84 140/85 (!) 178/93  Pulse: 80 69 77 79  Resp: 12 10 (!) 26 18  Temp:      TempSrc:      SpO2: 98% 97% 98% 97%  Weight:      Height:        Intake/Output Summary (Last 24 hours) at 08/30/2020 1104 Last data filed at 08/29/2020 2318 Gross per 24 hour  Intake --  Output 300 ml  Net -300  ml   Filed Weights   08/29/20 1420  Weight: 67.1 kg    Examination:  General exam: Appears calm and comfortable, on room air, sleepy but arousable but not following commands Respiratory system: Clear to auscultation. Respiratory effort normal. Cardiovascular system: S1 & S2 heard, RRR. No JVD, murmurs, rubs, gallops or clicks. No pedal edema. Gastrointestinal system: Abdomen is nondistended, soft and nontender. No organomegaly or masses felt. Normal bowel sounds heard. Central nervous system: Sleepy but arousable with verbal command but not following commands Skin: No rashes, lesions or ulcers    Data Reviewed: I have personally reviewed following labs and imaging studies  CBC: Recent Labs  Lab 08/29/20 1400 08/29/20 1623 08/29/20 2305 08/30/20 0259  WBC 10.3  --  11.9* 11.5*  NEUTROABS 8.5*  --   --   --   HGB 14.9 14.3 13.7 13.6  HCT 44.5 42.0 41.6 39.9  MCV 100.9*  --  102.0* 101.8*  PLT 188  --  185 [id number]   Basic Metabolic Panel: Recent Labs  Lab 08/29/20 1400 08/29/20 1623 08/29/20 2051 08/30/20 0259  NA 139 141  --  140  K 4.5 4.4  --  4.5  CL 107 106  --  108  CO2 19*  --   --  21*  GLUCOSE 174* 152*  --  163*  BUN 20 21  --  20  CREATININE 1.77* 1.60* 1.78* 1.75*  CALCIUM 9.0  --   --  8.8*   GFR: Estimated Creatinine Clearance: 33 mL/min (A) (by C-G formula based on SCr of 1.75 mg/dL (H)). Liver Function Tests: Recent Labs  Lab 08/29/20 1400  AST 23  ALT 12  ALKPHOS 56  BILITOT 1.4*  PROT 6.6  ALBUMIN 3.5   No results for input(s): LIPASE, AMYLASE in the last 168 hours. Recent Labs  Lab 08/29/20 1444  AMMONIA 39*   Coagulation Profile: Recent Labs  Lab 08/29/20 1400  INR 1.1   Cardiac Enzymes: No results for input(s): CKTOTAL, CKMB, CKMBINDEX, TROPONINI in the last 168 hours. BNP (last 3 results) No results for input(s): PROBNP in the last 8760 hours. HbA1C: No results for input(s): HGBA1C in the last 72 hours. CBG: Recent Labs   Lab 08/30/20 0753  GLUCAP 127*   Lipid Profile: No results for input(s): CHOL, HDL, LDLCALC, TRIG, CHOLHDL, LDLDIRECT in the last 72 hours. Thyroid Function Tests: No results for input(s): TSH, T4TOTAL, FREET4, T3FREE, THYROIDAB in the last 72 hours. Anemia Panel: No results for input(s): VITAMINB12, FOLATE, FERRITIN, TIBC, IRON, RETICCTPCT in the last 72 hours. Sepsis Labs: Recent Labs  Lab 08/29/20 1444  LATICACIDVEN 1.5    Recent Results (from the past 240 hour(s))  SARS Coronavirus 2 by RT PCR (hospital order, performed in [facility] hospital lab) Nasopharyngeal Nasopharyngeal Swab     Status: None   Collection Time: 08/29/20  2:32 PM   Specimen: Nasopharyngeal Swab  Result Value Ref Range Status   SARS Coronavirus 2 NEGATIVE NEGATIVE  Final    Comment: (NOTE) SARS-CoV-2 target nucleic acids are NOT DETECTED.  The SARS-CoV-2 RNA is generally detectable in [place] and lower respiratory specimens during the acute phase of infection. The lowest concentration of SARS-CoV-2 viral copies this assay can detect is 250 copies / mL. A negative result does not preclude SARS-CoV-2 infection and should not be used as the sole basis for treatment or other patient management decisions.  A negative result may occur with improper specimen collection / handling, submission of specimen other than nasopharyngeal swab, presence of viral mutation(s) within the areas targeted by this assay, and inadequate number of viral copies (<250 copies / mL). A negative result must be combined with clinical observations, patient history, and epidemiological information.  Fact Sheet for Patients:   StrictlyIdeas.no  Fact Sheet for Healthcare Providers: BankingDealers.co.za  This test is not yet approved or  cleared by the [place] FDA and has been authorized for detection and/or diagnosis of SARS-CoV-2 by FDA under an Emergency Use Authorization (EUA).   This EUA will remain in effect (meaning this test can be used) for the duration of the COVID-19 declaration under Section 564(b)(1) of the Act, 21 U.S.C. section 360bbb-3(b)(1), unless the authorization is terminated or revoked sooner.  Performed at [facility], [place] [address] [place], [place] [address]       Radiology Studies: CT Head Wo Contrast  Result Date: 08/29/2020 CLINICAL DATA:  Delirium. Additional history provided: Patient found unresponsive by wife, alert and oriented upon arrival to ED, patient reports generalized numbness. EXAM: CT HEAD WITHOUT CONTRAST TECHNIQUE: Contiguous axial images were obtained from the base of the skull through the vertex without intravenous contrast. COMPARISON:  Head CT 05/25/2020. FINDINGS: Brain: Moderate cerebral and cerebellar atrophy. Moderately advanced ill-defined hypoattenuation within the cerebral white matter is nonspecific, but compatible with chronic small vessel ischemic disease. There is no acute intracranial hemorrhage. No demarcated cortical infarct. No extra-axial fluid collection. No evidence of intracranial mass. No midline shift. Vascular: No hyperdense vessel.  Atherosclerotic calcifications. Skull: Normal. Negative for fracture or focal lesion. Sinuses/Orbits: Visualized orbits show no acute finding. Mild scattered paranasal sinus mucosal thickening. IMPRESSION: No evidence of acute intracranial abnormality. Moderate generalized atrophy of the brain with moderately advanced cerebral white matter chronic small vessel ischemic disease, stable as compared to the head CT of 05/25/2020. Mild paranasal sinus mucosal thickening. Electronically Signed   By: Kellie Simmering DO   On: 08/29/2020 15:53   MR BRAIN WO CONTRAST  Result Date: 08/30/2020 CLINICAL DATA:  Syncope. Normal neurological exam. Confusion. Found unresponsive. EXAM: MRI HEAD WITHOUT CONTRAST TECHNIQUE: Multiplanar, multiecho pulse sequences of the brain and surrounding  structures were obtained without intravenous contrast. COMPARISON:  Head CT same day. FINDINGS: Brain: Study suffers from motion degradation. Diffusion imaging does not show any acute or subacute infarction. Generalized brain atrophy. Moderate chronic small-vessel ischemic changes of the cerebral hemispheric white matter. No cortical or large vessel territory infarction. No mass, hemorrhage, hydrocephalus or extra-axial collection. Vascular: Major vessels at the base of the brain show flow. Skull and Fees cervical spine: Negative Sinuses/Orbits: Clear/normal Other: None IMPRESSION: Motion degraded exam. No acute or reversible finding. Age related atrophy. Moderate chronic small-vessel ischemic changes of the cerebral hemispheric white matter. Electronically Signed   By: Nelson Chimes M.D.   On: 08/30/2020 00:42   DG Chest Port 1 View  Result Date: 08/29/2020 CLINICAL DATA:  Unresponsive EXAM: PORTABLE CHEST 1 VIEW COMPARISON:  May 25, 2020 FINDINGS: The  cardiomediastinal silhouette is unchanged and enlarged in contour.Incomplete assessment of the LEFT costophrenic angle. No pleural effusion. No pneumothorax. No acute pleuroparenchymal abnormality. Visualized abdomen is unremarkable. Degenerative changes of the acromioclavicular joints. Degenerative changes of the thoracolumbar spine. Vascular calcifications. IMPRESSION: Unchanged cardiomegaly. Electronically Signed   By: Valentino Saxon MD   On: 08/29/2020 14:42    Scheduled Meds: . enoxaparin (LOVENOX) injection  40 mg Subcutaneous Q24H  . sodium chloride flush  3 mL Intravenous Q12H   Continuous Infusions: . lactated ringers Stopped (08/30/20 0954)     LOS: 0 days   Time spent: 35 minutes   Nickcole Bralley Loann Quill, MD [facility]  If 7PM-7AM, please contact night-coverage [email or web address] 08/30/2020, 11:04 AM

## 2020-08-30 NOTE — ED Notes (Signed)
Vivien Rota (daughter) [phone] would like an update

## 2020-08-30 NOTE — Evaluation (Signed)
Occupational Therapy Evaluation Patient Details Name: Austin Orozco MRN: [id number] DOB: 12/13/1940 Today's Date: 08/30/2020    History of Present Illness 80 year old male with past medical history of A. fib-on Eliquis, CKD stage IIIb with (baseline creatinine of 2 and GFR in 40s), hypothyroidism, chronic back pain, hypertension, diabetes mellitus presents to the emergency department for evaluation of chest pain, syncope and confusion.   Clinical Impression   Pt admitted with above. He demonstrates the below listed deficits and will benefit from continued OT to maximize safety and independence with BADLs.  He demonstrates impaired cognition, impaired balance, decreased safety awareness.  He currently requires mod A for LB ADLs and frequent redirection to keep him on task.  He reports he lives with his wife and was fully independend PTA.  Recommend SNF.       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 Restrictions Weight Bearing Restrictions: No      Mobility Bed Mobility Overal bed mobility: Needs Assistance Bed Mobility: Supine to Sit;Sit to Supine     Supine to sit: Min guard Sit to supine: Min guard   General bed mobility comments: min guard for safety    Transfers Overall transfer level: Needs assistance Equipment used: 1 person hand held assist Transfers: Sit to/from Stand Sit to Stand: Mod assist         General transfer comment: mod A for balance    Balance Overall balance assessment: Needs assistance Sitting-balance support: Feet supported Sitting balance-Leahy Scale: Fair     Standing balance support: Single extremity supported Standing balance-Leahy Scale: Poor Standing balance comment: requires mod A and UE support                           ADL  either performed or assessed with clinical judgement   ADL Overall ADL's : Needs assistance/impaired Eating/Feeding: Independent   Grooming: Wash/dry hands;Wash/dry face;Oral care;Brushing hair;Moderate assistance;Standing   [place] Body Bathing: Set up;Supervision/ safety;Sitting   Lower Body Bathing: Moderate assistance;Sit to/from stand Lower Body Bathing Details (indicate cue type and reason): for balance [place] Body Dressing : Set up;Sitting;Supervision/safety   Lower Body Dressing: Moderate assistance;Sit to/from stand Lower Body Dressing Details (indicate cue type and reason): for balance Toilet Transfer: Moderate assistance;Stand-pivot;BSC   Toileting- Clothing Manipulation and Hygiene: Moderate assistance;Sit to/from stand       Functional mobility during ADLs: Moderate assistance       Vision   Additional Comments: pt endorses visual hallucinations     Perception     Praxis      Pertinent Vitals/Pain Pain Assessment: No/denies pain Faces Pain Scale: Hurts little more Pain Location: back Pain Descriptors / Indicators: Grimacing;Moaning Pain Intervention(s): Monitored during session;Repositioned     Hand Dominance     Extremity/Trunk Assessment [place] Extremity Assessment [place] Extremity Assessment: Generalized weakness   Lower Extremity Assessment Lower Extremity Assessment: Generalized weakness   Cervical / Trunk Assessment Cervical / Trunk Assessment: Kyphotic   Communication Communication Communication: Expressive difficulties;Other (comment) (pt frequently speaking Spanish although English is his primary language)   Cognition Arousal/Alertness: Awake/alert Behavior During Therapy: Impulsive Overall Cognitive Status: Impaired/Different from baseline Area of Impairment: Orientation;Attention;Memory;Following commands;Safety/judgement;Awareness;Problem solving                 Orientation Level:  Disoriented to;Situation;Time;Place Current  Attention Level: Sustained;Selective Memory: Decreased short-term memory Following Commands: Follows one step commands inconsistently;Follows one step commands with increased time Safety/Judgement: Decreased awareness of safety Awareness: Intellectual Problem Solving: Requires verbal cues;Requires tactile cues General Comments: Pt is very distractable.  he frequently speaks in Spanish   General Comments       Exercises     Shoulder Instructions      Home Living Family/patient expects to be discharged to:: Private residence Living Arrangements: Spouse/significant other Available Help at Discharge: Family;Available 24 hours/day Type of Home: House Home Access: Stairs to enter [employer] of Steps: 8 Entrance Stairs-Rails: Right Home Layout: One level     Bathroom Shower/Tub: Occupational psychologist: Standard Bathroom Accessibility: Yes   Home Equipment: Environmental consultant - 2 wheels   Additional Comments: unsure of accuracy of info      Prior Functioning/Environment Level of Independence: Independent        Comments: pt reports he is fully independent with ADLs.  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, Pharmacist, hospital, and could not generate the others        OT Problem List: Decreased strength;Decreased activity tolerance;Impaired balance (sitting and/or standing);Decreased cognition;Decreased safety awareness;Decreased knowledge of use of DME or AE;Decreased knowledge of precautions      OT Treatment/Interventions: Self-care/ADL training;Therapeutic exercise;Neuromuscular education;DME and/or AE instruction;Therapeutic activities;Cognitive remediation/compensation;Patient/family education;Balance training    OT Goals(Current goals can be found in the care plan section) Acute Rehab OT Goals Patient Stated Goal: to know what happened to him OT Goal Formulation: With patient Time For Goal Achievement: 09/13/20 Potential to Achieve Goals: Good ADL Goals Pt Will  Perform Grooming: with min assist;standing Pt Will Perform Lower Body Bathing: with min assist;sit to/from stand Pt Will Perform Lower Body Dressing: with min assist;sit to/from stand Pt Will Transfer to Toilet: with min assist;ambulating;regular height toilet;bedside commode;grab bars Pt Will Perform Toileting - Clothing Manipulation and hygiene: with min assist;sit to/from stand Additional ADL Goal #1: Pt will selectively attend to familiar ADL tasks x 5 mins with no cues  OT Frequency: Min 2X/week   Barriers to D/C: Decreased caregiver support  unsure wife can provide current level of assist       Co-evaluation              AM-PAC OT "6 Clicks" Daily Activity     Outcome Measure Help from another person eating meals?: None Help from another person taking care of personal grooming?: A Little Help from another person toileting, which includes using toliet, bedpan, or urinal?: A Lot Help from another person bathing (including washing, rinsing, drying)?: A Lot Help from another person to put on and taking off regular [place] body clothing?: A Little Help from another person to put on and taking off regular lower body clothing?: A Lot 6 Click Score: 16   End of Session Nurse Communication: Mobility status  Activity Tolerance: Patient limited by pain;Patient limited by fatigue Patient left: in bed;with call bell/phone within reach;with bed alarm set  OT Visit Diagnosis: Unsteadiness on feet (R26.81);Cognitive communication deficit (R41.841)                Time: [id number] OT Time Calculation (min): 18 min Charges:  OT General Charges $OT Visit: 1 Visit OT Evaluation $OT Eval Moderate Complexity: 1 Mod  Nilsa Nutting., OTR/L Acute Rehabilitation Services Pager [phone] Office [phone]   Lucille Passy M 08/30/2020, 6:45 PM

## 2020-08-30 NOTE — ED Notes (Signed)
Messaged the admitting provider for pain medication. Patient complaining of increased back pain.

## 2020-08-31 DIAGNOSIS — R55 Syncope and collapse: Secondary | ICD-10-CM

## 2020-08-31 DIAGNOSIS — R079 Chest pain, unspecified: Secondary | ICD-10-CM | POA: Diagnosis not present

## 2020-08-31 DIAGNOSIS — N179 Acute kidney failure, unspecified: Secondary | ICD-10-CM

## 2020-08-31 DIAGNOSIS — G9340 Encephalopathy, unspecified: Secondary | ICD-10-CM | POA: Diagnosis not present

## 2020-08-31 LAB — BASIC METABOLIC PANEL
Anion gap: 10 (ref 5–15)
BUN: 21 mg/dL (ref 8–23)
CO2: 22 mmol/L (ref 22–32)
Calcium: 8.9 mg/dL (ref 8.9–10.3)
Chloride: 107 mmol/L (ref 98–111)
Creatinine, Ser: 1.63 mg/dL — ABNORMAL HIGH (ref 0.61–1.24)
GFR, Estimated: 43 mL/min — ABNORMAL LOW (ref >60)
Glucose, Bld: 134 mg/dL — ABNORMAL HIGH (ref 70–99)
Potassium: 4.1 mmol/L (ref 3.5–5.1)
Sodium: 139 mmol/L (ref 135–145)

## 2020-08-31 LAB — CBC
HCT: 38.6 % — ABNORMAL LOW (ref 39.0–52.0)
Hemoglobin: 13.4 g/dL (ref 13.0–17.0)
MCH: 34.8 pg — ABNORMAL HIGH (ref 26.0–34.0)
MCHC: 34.7 g/dL (ref 30.0–36.0)
MCV: 100.3 fL — ABNORMAL HIGH (ref 80.0–100.0)
Platelets: 175 10*3/uL (ref 150–400)
RBC: 3.85 MIL/uL — ABNORMAL LOW (ref 4.22–5.81)
RDW: 14.2 % (ref 11.5–15.5)
WBC: 12.2 10*3/uL — ABNORMAL HIGH (ref 4.0–10.5)
nRBC: 0 % (ref 0.0–0.2)

## 2020-08-31 LAB — GLUCOSE, CAPILLARY
Glucose-Capillary: 117 mg/dL — ABNORMAL HIGH (ref 70–99)
Glucose-Capillary: 221 mg/dL — ABNORMAL HIGH (ref 70–99)

## 2020-08-31 MED ORDER — METOPROLOL TARTRATE 25 MG PO TABS
25.0000 mg | ORAL_TABLET | Freq: Two times a day (BID) | ORAL | 0 refills | Status: AC
Start: 2020-08-31 — End: ?

## 2020-08-31 MED ORDER — ELIQUIS 5 MG PO TABS: 5.0000 mg | ORAL_TABLET | Freq: Two times a day (BID) | ORAL | 0 refills | Status: AC

## 2020-08-31 MED ORDER — NITROGLYCERIN 0.4 MG SL SUBL: 0.4000 mg | SUBLINGUAL_TABLET | SUBLINGUAL | 0 refills | Status: AC | PRN

## 2020-08-31 NOTE — TOC Transition Note (Signed)
Transition of Care [facility]) - CM/SW Discharge Note   Patient Details  Name: Austin Orozco MRN: [id number] Date of Birth: [birth year]  Transition of [facility]) CM/SW Contact:  Maebelle Munroe, RN Phone Number: 08/31/2020, 9:39 AM   Clinical Narrative:   [facility] team for discharge planning. PT and OT recommended SNF placement. Pt declined. He states he wants to return home. Home Health services arranged with patient's consent. [place] to provide PT and OT services. Patient agrees with the treatment plan. Call to wife per patient's request. Left message for return call. Current dischasrge plan- home with home health. No barriers noted.    Final next level of care: [place] Barriers to Discharge: No Barriers Identified   Patient Goals and CMS Choice Patient states their goals for this hospitalization and ongoing recovery are:: I want to return home. CMS Medicare.gov Compare Post Acute Care list provided to:: Patient Choice offered to / list presented to : Patient  Discharge Placement                       Discharge Plan and Services                          HH Arranged: PT,OT [place]: [place] Date [place]: 08/31/20 Time [place]: 0930 Representative spoke with at [place]: Tommi Rumps-- [phone]  Social Determinants of Health ([place]) Interventions     Readmission Risk Interventions No flowsheet data found.

## 2020-08-31 NOTE — Discharge Summary (Signed)
Physician Discharge Summary  Austin Orozco [id number] DOB: 1941/05/14 DOA: 08/29/2020  PCP: Pcp, No  Admit date: 08/29/2020 Discharge date: 08/31/2020  Admitted From: Home Disposition:  Home  Recommendations for Outpatient Follow-up:  1. Follow-up with PCP in 1 week 2. Repeat CBC and BMP on follow-up visit 3. Follow-up with cardiology in 1 to 2 weeks  Home Health: Yes Equipment/Devices:None  Discharge Condition: Stable CODE STATUS: Full code Diet recommendation: Low-sodium diet  Brief/Interim Summary: Patient is 80 year old male with past medical history of A. fib-on Eliquis, CKD stage IIIb with (baseline creatinine of 2 and GFR in 40s), hypothyroidism, chronic back pain, hypertension, type II diabetes mellitus presents to the emergency department for evaluation of chest pain, syncope and confusion.  Upon arrival to ED: Patient's vital signs stable, troponin: 41 then 39, leukocytosis of 11.5, BMP shows creatinine of 1.7, GFR: 39, patient started on IV fluids. EMS activated him as NSTEMI but cardiology reviewed his EKG and stated he was not having STEMI and do not need acute intervention.  Patient admitted under [facility] for further evaluation and management of chest pain syncope and confusion.  Chest pain: -Resolved. -Troponin 41-39-53-44.  EKG: No acute ischemic changes. -Reviewed care everywhere record-he had echo done on 11/21 which showed ejection fraction of 40%, mild MR, mild AR/AS -ED: EKG reviewed by cardiology and does not believe that patient needs any acute intervention at this time. - nitro as needed for chest pain.  -Need to follow-up with cardiology outpatient.  Acute metabolic encephalopathy/syncope: -Resolved -Unknown underlying etiology.  MRI and CT head: Negative for acute findings.  UA negative for infection.  UDS positive for opioids.  Ethanol: WNL, ammonia level: WNL -B12, folate: WNL -Consult PT/OT-recommend SNF however patient refused to  go to SNF and tells me that he will take care of himself at home with the help of his wife.  CKD stage IIIb: -Baseline creatinine: 2 and GFR: 40.  Creatinine on arrival: 1.7, GFR: 39 -Patient started on IV fluids.  Creatinine: 1.63, GFR: 43 on the day of discharge which is his baseline.  Hypertension: Blood pressure in 150s/80s.  Continued metoprolol.  Refills given.  Chronic systolic CHF: Patient appears euvolemic on exam -Reviewed echo from care everywhere from 11/21 which showed ejection fraction of 40% -Patient does not see cardiology outpatient-recommended to follow-up with cardiology.  Paroxysmal A. Fib: -Reviewed care everywhere-patient was admitted in October 2021 with A. fib with RVR and was started on Eliquis and metoprolol.  Patient is not sure if he takes Eliquis or metoprolol at home. -Discussed with pharmacy-last refills on Eliquis/metoprolol was in December-refills given to the patient and strictly advised to follow-up with cardiology as an outpatient.  Type II diabetes mellitus:  Well controlled.  A1c 6.1% -Resumed glipizide and Metformin  Hypothyroidism:  TSH elevated at 12.4 -Not sure about patient compliance.  Continued same dose of levothyroxine-follow-up with PCP and repeat TSH in 4 to 6 weeks.  Chronic back pain: Continued as needed pain medication -Consulted PT/OT  Macrocytosis:  B12, folate: WNL  Leukocytosis: Likely reactive.  Chest x-ray negative for pneumonia, COVID-19 negative, UA negative for infection.   No indication of antibiotics at this time.  Patient is stable for the discharge.  He needs to take his medications as prescribed along with close follow-up with PCP and cardiology.  PT/OT recommended SNF however patient refused to go and tells me that he is able to take care of himself with the help of his wife.  Tells me that he has a walker at home and does not need DME prescription.  Home health services PT/OT arranged at the time of  discharge.  Discharge Diagnoses:  Chest pain ruled out ACS Acute metabolic encephalopathy/syncope CKD stage IIIb Hypertension Chronic systolic CHF Paroxysmal A. fib Type II diabetes mellitus Hypothyroidism Chronic back pain Macrocytosis Leukocytosis   Discharge Instructions  Discharge Instructions    Diet - low sodium heart healthy   Complete by: As directed    Discharge instructions   Complete by: As directed    Follow-up with PCP in 1 week Repeat CBC and BMP on follow-up visit Follow-up with cardiology in 1 to 2 weeks   Increase activity slowly   Complete by: As directed      Allergies as of 08/31/2020   No Known Allergies     Medication List    STOP taking these medications   doxycycline 100 MG tablet Commonly known as: VIBRA-TABS   PREDNISONE PO     TAKE these medications   allopurinol 300 MG tablet Commonly known as: ZYLOPRIM Take 300 mg by mouth daily as needed (gout).   atorvastatin 20 MG tablet Commonly known as: LIPITOR Take 1 tablet by mouth daily.   D3 50 MCG (2000 UT) Tabs Generic drug: Cholecalciferol Take 1 tablet by mouth daily.   Eliquis 5 MG Tabs tablet Generic drug: apixaban Take 1 tablet (5 mg total) by mouth 2 (two) times daily.   glipiZIDE 5 MG tablet Commonly known as: GLUCOTROL Take 2.5-5 mg by mouth in the morning and at bedtime.   HYDROcodone-acetaminophen 5-325 MG tablet Commonly known as: NORCO/VICODIN Take 1 tablet by mouth every 6 (six) hours as needed (pain).   levothyroxine 25 MCG tablet Commonly known as: SYNTHROID Take 25 mcg by mouth daily.   lisinopril 10 MG tablet Commonly known as: ZESTRIL Take 10 mg by mouth daily.   metoprolol tartrate 25 MG tablet Commonly known as: LOPRESSOR Take 1 tablet (25 mg total) by mouth in the morning and at bedtime.   niacin 500 MG CR capsule Take 500 mg by mouth daily.   nitroGLYCERIN 0.4 MG SL tablet Commonly known as: NITROSTAT Place 1 tablet (0.4 mg total) under  the tongue every 5 (five) minutes as needed for chest pain.   tizanidine 6 MG capsule Commonly known as: ZANAFLEX Take 6 mg by mouth every 8 (eight) hours as needed for muscle spasms.   traMADol 50 MG tablet Commonly known as: ULTRAM Take 50 mg by mouth 2 (two) times daily as needed for moderate pain.   traZODone 50 MG tablet Commonly known as: DESYREL Take 50 mg by mouth at bedtime.   vitamin B-12 1000 MCG tablet Commonly known as: CYANOCOBALAMIN Take 1,000 mcg by mouth daily.       Follow-up Information    [facility] Follow up in 2 week(s).   Specialty: Cardiology Contact information: [address] [place] [phone]             No Known Allergies  Consultations:  None   Procedures/Studies: CT Head Wo Contrast  Result Date: 08/29/2020 CLINICAL DATA:  Delirium. Additional history provided: Patient found unresponsive by wife, alert and oriented upon arrival to ED, patient reports generalized numbness. EXAM: CT HEAD WITHOUT CONTRAST TECHNIQUE: Contiguous axial images were obtained from the base of the skull through the vertex without intravenous contrast. COMPARISON:  Head CT 05/25/2020. FINDINGS: Brain: Moderate cerebral and cerebellar atrophy.  Moderately advanced ill-defined hypoattenuation within the cerebral white matter is nonspecific, but compatible with chronic small vessel ischemic disease. There is no acute intracranial hemorrhage. No demarcated cortical infarct. No extra-axial fluid collection. No evidence of intracranial mass. No midline shift. Vascular: No hyperdense vessel.  Atherosclerotic calcifications. Skull: Normal. Negative for fracture or focal lesion. Sinuses/Orbits: Visualized orbits show no acute finding. Mild scattered paranasal sinus mucosal thickening. IMPRESSION: No evidence of acute intracranial abnormality. Moderate generalized atrophy of the brain with moderately advanced cerebral  white matter chronic small vessel ischemic disease, stable as compared to the head CT of 05/25/2020. Mild paranasal sinus mucosal thickening. Electronically Signed   By: Kellie Simmering DO   On: 08/29/2020 15:53   MR BRAIN WO CONTRAST  Result Date: 08/30/2020 CLINICAL DATA:  Syncope. Normal neurological exam. Confusion. Found unresponsive. EXAM: MRI HEAD WITHOUT CONTRAST TECHNIQUE: Multiplanar, multiecho pulse sequences of the brain and surrounding structures were obtained without intravenous contrast. COMPARISON:  Head CT same day. FINDINGS: Brain: Study suffers from motion degradation. Diffusion imaging does not show any acute or subacute infarction. Generalized brain atrophy. Moderate chronic small-vessel ischemic changes of the cerebral hemispheric white matter. No cortical or large vessel territory infarction. No mass, hemorrhage, hydrocephalus or extra-axial collection. Vascular: Major vessels at the base of the brain show flow. Skull and Sann cervical spine: Negative Sinuses/Orbits: Clear/normal Other: None IMPRESSION: Motion degraded exam. No acute or reversible finding. Age related atrophy. Moderate chronic small-vessel ischemic changes of the cerebral hemispheric white matter. Electronically Signed   By: Nelson Chimes M.D.   On: 08/30/2020 00:42   DG Chest Port 1 View  Result Date: 08/29/2020 CLINICAL DATA:  Unresponsive EXAM: PORTABLE CHEST 1 VIEW COMPARISON:  May 25, 2020 FINDINGS: The cardiomediastinal silhouette is unchanged and enlarged in contour.Incomplete assessment of the LEFT costophrenic angle. No pleural effusion. No pneumothorax. No acute pleuroparenchymal abnormality. Visualized abdomen is unremarkable. Degenerative changes of the acromioclavicular joints. Degenerative changes of the thoracolumbar spine. Vascular calcifications. IMPRESSION: Unchanged cardiomegaly. Electronically Signed   By: Valentino Saxon MD   On: 08/29/2020 14:42      Subjective: Patient seen and examined.   Sitting comfortably on the bed.  Alert and oriented and communicating well and tells me that he is feeling great this morning and his chest pain and back pain has improved.  He wishes to go home today.  Discharge Exam: Vitals:   08/30/20 2109 08/31/20 0608  BP: (!) 151/90 (!) 157/83  Pulse: 70 67  Resp: 18 18  Temp: 98 F (36.7 C) 97.6 F (36.4 C)  SpO2: 98% 100%   Vitals:   08/30/20 1500 08/30/20 2109 08/31/20 0500 08/31/20 0608  BP: 129/70 (!) 151/90  (!) 157/83  Pulse: 71 70  67  Resp:  18  18  Temp: 98.7 F (37.1 C) 98 F (36.7 C)  97.6 F (36.4 C)  TempSrc: Oral Oral  Oral  SpO2:  98%  100%  Weight:   66.6 kg   Height:        General: Pt is alert, awake, oriented x3, not in acute distress, on room air, communicating well, Cardiovascular: RRR, S1/S2 +, no rubs, no gallops Respiratory: CTA bilaterally, no wheezing, no rhonchi Abdominal: Soft, NT, ND, bowel sounds + Extremities: no edema, no cyanosis    The results of significant diagnostics from this hospitalization (including imaging, microbiology, ancillary and laboratory) are listed below for reference.     Microbiology: Recent Results (from the past 240 hour(s))  SARS  Coronavirus 2 by RT PCR (hospital order, performed in [facility] hospital lab) Nasopharyngeal Nasopharyngeal Swab     Status: None   Collection Time: 08/29/20  2:32 PM   Specimen: Nasopharyngeal Swab  Result Value Ref Range Status   SARS Coronavirus 2 NEGATIVE NEGATIVE Final    Comment: (NOTE) SARS-CoV-2 target nucleic acids are NOT DETECTED.  The SARS-CoV-2 RNA is generally detectable in [place] and lower respiratory specimens during the acute phase of infection. The lowest concentration of SARS-CoV-2 viral copies this assay can detect is 250 copies / mL. A negative result does not preclude SARS-CoV-2 infection and should not be used as the sole basis for treatment or other patient management decisions.  A negative result may occur  with improper specimen collection / handling, submission of specimen other than nasopharyngeal swab, presence of viral mutation(s) within the areas targeted by this assay, and inadequate number of viral copies (<250 copies / mL). A negative result must be combined with clinical observations, patient history, and epidemiological information.  Fact Sheet for Patients:   StrictlyIdeas.no  Fact Sheet for Healthcare Providers: BankingDealers.co.za  This test is not yet approved or  cleared by the [place] FDA and has been authorized for detection and/or diagnosis of SARS-CoV-2 by FDA under an Emergency Use Authorization (EUA).  This EUA will remain in effect (meaning this test can be used) for the duration of the COVID-19 declaration under Section 564(b)(1) of the Act, 21 U.S.C. section 360bbb-3(b)(1), unless the authorization is terminated or revoked sooner.  Performed at [facility], [place] [address], [place], [place] [address]      Labs: BNP (last 3 results) No results for input(s): BNP in the last 8760 hours. Basic Metabolic Panel: Recent Labs  Lab 08/29/20 1400 08/29/20 1623 08/29/20 2051 08/30/20 0259 08/31/20 0424  NA 139 141  --  140 139  K 4.5 4.4  --  4.5 4.1  CL 107 106  --  108 107  CO2 19*  --   --  21* 22  GLUCOSE 174* 152*  --  163* 134*  BUN 20 21  --  20 21  CREATININE 1.77* 1.60* 1.78* 1.75* 1.63*  CALCIUM 9.0  --   --  8.8* 8.9   Liver Function Tests: Recent Labs  Lab 08/29/20 1400  AST 23  ALT 12  ALKPHOS 56  BILITOT 1.4*  PROT 6.6  ALBUMIN 3.5   No results for input(s): LIPASE, AMYLASE in the last 168 hours. Recent Labs  Lab 08/29/20 1444 08/30/20 1105  AMMONIA 39* 12   CBC: Recent Labs  Lab 08/29/20 1400 08/29/20 1623 08/29/20 2305 08/30/20 0259 08/31/20 0424  WBC 10.3  --  11.9* 11.5* 12.2*  NEUTROABS 8.5*  --   --   --   --   HGB 14.9 14.3 13.7 13.6 13.4  HCT 44.5 42.0  41.6 39.9 38.6*  MCV 100.9*  --  102.0* 101.8* 100.3*  PLT 188  --  185 165 175   Cardiac Enzymes: No results for input(s): CKTOTAL, CKMB, CKMBINDEX, TROPONINI in the last 168 hours. BNP: Invalid input(s): POCBNP CBG: Recent Labs  Lab 08/30/20 0753 08/30/20 1540 08/30/20 2257 08/31/20 0652  GLUCAP 127* 171* 180* 117*   D-Dimer No results for input(s): DDIMER in the last 72 hours. Hgb A1c Recent Labs    08/30/20 1410  HGBA1C 6.1*   Lipid Profile No results for input(s): CHOL, HDL, LDLCALC, TRIG, CHOLHDL, LDLDIRECT in the last 72 hours. Thyroid function  studies Recent Labs    08/30/20 1410  TSH 12.491*   Anemia work up Recent Labs    08/30/20 1410  VITAMINB12 312  FOLATE 7.2   Urinalysis    Component Value Date/Time   COLORURINE YELLOW 08/29/2020 [place] 08/29/2020 2305   LABSPEC 1.016 08/29/2020 2305   PHURINE 6.0 08/29/2020 2305   GLUCOSEU NEGATIVE 08/29/2020 2305   HGBUR NEGATIVE 08/29/2020 2305   BILIRUBINUR NEGATIVE 08/29/2020 2305   KETONESUR 5 (A) 08/29/2020 2305   PROTEINUR >=300 (A) 08/29/2020 2305   NITRITE NEGATIVE 08/29/2020 2305   LEUKOCYTESUR NEGATIVE 08/29/2020 2305   Sepsis Labs Invalid input(s): PROCALCITONIN,  WBC,  LACTICIDVEN Microbiology Recent Results (from the past 240 hour(s))  SARS Coronavirus 2 by RT PCR (hospital order, performed in [facility] hospital lab) Nasopharyngeal Nasopharyngeal Swab     Status: None   Collection Time: 08/29/20  2:32 PM   Specimen: Nasopharyngeal Swab  Result Value Ref Range Status   SARS Coronavirus 2 NEGATIVE NEGATIVE Final    Comment: (NOTE) SARS-CoV-2 target nucleic acids are NOT DETECTED.  The SARS-CoV-2 RNA is generally detectable in [place] and lower respiratory specimens during the acute phase of infection. The lowest concentration of SARS-CoV-2 viral copies this assay can detect is 250 copies / mL. A negative result does not preclude SARS-CoV-2 infection and should not be  used as the sole basis for treatment or other patient management decisions.  A negative result may occur with improper specimen collection / handling, submission of specimen other than nasopharyngeal swab, presence of viral mutation(s) within the areas targeted by this assay, and inadequate number of viral copies (<250 copies / mL). A negative result must be combined with clinical observations, patient history, and epidemiological information.  Fact Sheet for Patients:   StrictlyIdeas.no  Fact Sheet for Healthcare Providers: BankingDealers.co.za  This test is not yet approved or  cleared by the [place] FDA and has been authorized for detection and/or diagnosis of SARS-CoV-2 by FDA under an Emergency Use Authorization (EUA).  This EUA will remain in effect (meaning this test can be used) for the duration of the COVID-19 declaration under Section 564(b)(1) of the Act, 21 U.S.C. section 360bbb-3(b)(1), unless the authorization is terminated or revoked sooner.  Performed at [facility], [place] [address] [place], [place] [address]      Time coordinating discharge: Over 30 minutes  SIGNED:   Mckinley Jewel, MD  [facility] 08/31/2020, 9:34 AM Pager   If 7PM-7AM, please contact night-coverage [email or web address]

## 2020-09-05 DIAGNOSIS — I130 Hypertensive heart and chronic kidney disease with heart failure and stage 1 through stage 4 chronic kidney disease, or unspecified chronic kidney disease: Secondary | ICD-10-CM | POA: Diagnosis not present

## 2020-09-05 DIAGNOSIS — E1122 Type 2 diabetes mellitus with diabetic chronic kidney disease: Secondary | ICD-10-CM | POA: Diagnosis not present

## 2020-09-05 DIAGNOSIS — N1832 Chronic kidney disease, stage 3b: Secondary | ICD-10-CM | POA: Diagnosis not present

## 2020-09-05 DIAGNOSIS — N179 Acute kidney failure, unspecified: Secondary | ICD-10-CM | POA: Diagnosis not present

## 2020-09-05 DIAGNOSIS — I5022 Chronic systolic (congestive) heart failure: Secondary | ICD-10-CM | POA: Diagnosis not present

## 2020-09-05 DIAGNOSIS — M545 Low back pain, unspecified: Secondary | ICD-10-CM | POA: Diagnosis not present

## 2020-09-05 DIAGNOSIS — E039 Hypothyroidism, unspecified: Secondary | ICD-10-CM | POA: Diagnosis not present

## 2020-09-05 DIAGNOSIS — M47815 Spondylosis without myelopathy or radiculopathy, thoracolumbar region: Secondary | ICD-10-CM | POA: Diagnosis not present

## 2020-09-05 DIAGNOSIS — I480 Paroxysmal atrial fibrillation: Secondary | ICD-10-CM | POA: Diagnosis not present

## 2020-09-09 DIAGNOSIS — E1165 Type 2 diabetes mellitus with hyperglycemia: Secondary | ICD-10-CM | POA: Diagnosis not present

## 2020-09-09 DIAGNOSIS — N184 Chronic kidney disease, stage 4 (severe): Secondary | ICD-10-CM | POA: Diagnosis not present

## 2020-09-09 DIAGNOSIS — Z299 Encounter for prophylactic measures, unspecified: Secondary | ICD-10-CM | POA: Diagnosis not present

## 2020-09-09 DIAGNOSIS — E1122 Type 2 diabetes mellitus with diabetic chronic kidney disease: Secondary | ICD-10-CM | POA: Diagnosis not present

## 2020-09-09 DIAGNOSIS — I10 Essential (primary) hypertension: Secondary | ICD-10-CM | POA: Diagnosis not present

## 2020-09-09 DIAGNOSIS — Z09 Encounter for follow-up examination after completed treatment for conditions other than malignant neoplasm: Secondary | ICD-10-CM | POA: Diagnosis not present

## 2020-09-10 DIAGNOSIS — I5022 Chronic systolic (congestive) heart failure: Secondary | ICD-10-CM | POA: Diagnosis not present

## 2020-09-10 DIAGNOSIS — I130 Hypertensive heart and chronic kidney disease with heart failure and stage 1 through stage 4 chronic kidney disease, or unspecified chronic kidney disease: Secondary | ICD-10-CM | POA: Diagnosis not present

## 2020-09-10 DIAGNOSIS — E039 Hypothyroidism, unspecified: Secondary | ICD-10-CM | POA: Diagnosis not present

## 2020-09-10 DIAGNOSIS — E1122 Type 2 diabetes mellitus with diabetic chronic kidney disease: Secondary | ICD-10-CM | POA: Diagnosis not present

## 2020-09-10 DIAGNOSIS — N179 Acute kidney failure, unspecified: Secondary | ICD-10-CM | POA: Diagnosis not present

## 2020-09-10 DIAGNOSIS — M545 Low back pain, unspecified: Secondary | ICD-10-CM | POA: Diagnosis not present

## 2020-09-10 DIAGNOSIS — M47815 Spondylosis without myelopathy or radiculopathy, thoracolumbar region: Secondary | ICD-10-CM | POA: Diagnosis not present

## 2020-09-10 DIAGNOSIS — N1832 Chronic kidney disease, stage 3b: Secondary | ICD-10-CM | POA: Diagnosis not present

## 2020-09-10 DIAGNOSIS — I480 Paroxysmal atrial fibrillation: Secondary | ICD-10-CM | POA: Diagnosis not present

## 2020-09-12 DIAGNOSIS — N179 Acute kidney failure, unspecified: Secondary | ICD-10-CM | POA: Diagnosis not present

## 2020-09-12 DIAGNOSIS — E039 Hypothyroidism, unspecified: Secondary | ICD-10-CM | POA: Diagnosis not present

## 2020-09-12 DIAGNOSIS — I5022 Chronic systolic (congestive) heart failure: Secondary | ICD-10-CM | POA: Diagnosis not present

## 2020-09-12 DIAGNOSIS — N1832 Chronic kidney disease, stage 3b: Secondary | ICD-10-CM | POA: Diagnosis not present

## 2020-09-12 DIAGNOSIS — I480 Paroxysmal atrial fibrillation: Secondary | ICD-10-CM | POA: Diagnosis not present

## 2020-09-12 DIAGNOSIS — E1122 Type 2 diabetes mellitus with diabetic chronic kidney disease: Secondary | ICD-10-CM | POA: Diagnosis not present

## 2020-09-12 DIAGNOSIS — M47815 Spondylosis without myelopathy or radiculopathy, thoracolumbar region: Secondary | ICD-10-CM | POA: Diagnosis not present

## 2020-09-12 DIAGNOSIS — I130 Hypertensive heart and chronic kidney disease with heart failure and stage 1 through stage 4 chronic kidney disease, or unspecified chronic kidney disease: Secondary | ICD-10-CM | POA: Diagnosis not present

## 2020-09-12 DIAGNOSIS — M545 Low back pain, unspecified: Secondary | ICD-10-CM | POA: Diagnosis not present

## 2020-09-15 DIAGNOSIS — N1832 Chronic kidney disease, stage 3b: Secondary | ICD-10-CM | POA: Diagnosis not present

## 2020-09-15 DIAGNOSIS — M47815 Spondylosis without myelopathy or radiculopathy, thoracolumbar region: Secondary | ICD-10-CM | POA: Diagnosis not present

## 2020-09-15 DIAGNOSIS — I130 Hypertensive heart and chronic kidney disease with heart failure and stage 1 through stage 4 chronic kidney disease, or unspecified chronic kidney disease: Secondary | ICD-10-CM | POA: Diagnosis not present

## 2020-09-15 DIAGNOSIS — I480 Paroxysmal atrial fibrillation: Secondary | ICD-10-CM | POA: Diagnosis not present

## 2020-09-15 DIAGNOSIS — E039 Hypothyroidism, unspecified: Secondary | ICD-10-CM | POA: Diagnosis not present

## 2020-09-15 DIAGNOSIS — N179 Acute kidney failure, unspecified: Secondary | ICD-10-CM | POA: Diagnosis not present

## 2020-09-15 DIAGNOSIS — M545 Low back pain, unspecified: Secondary | ICD-10-CM | POA: Diagnosis not present

## 2020-09-15 DIAGNOSIS — E1122 Type 2 diabetes mellitus with diabetic chronic kidney disease: Secondary | ICD-10-CM | POA: Diagnosis not present

## 2020-09-15 DIAGNOSIS — I5022 Chronic systolic (congestive) heart failure: Secondary | ICD-10-CM | POA: Diagnosis not present

## 2020-09-18 DIAGNOSIS — N1832 Chronic kidney disease, stage 3b: Secondary | ICD-10-CM | POA: Diagnosis not present

## 2020-09-18 DIAGNOSIS — M545 Low back pain, unspecified: Secondary | ICD-10-CM | POA: Diagnosis not present

## 2020-09-18 DIAGNOSIS — N179 Acute kidney failure, unspecified: Secondary | ICD-10-CM | POA: Diagnosis not present

## 2020-09-18 DIAGNOSIS — I5022 Chronic systolic (congestive) heart failure: Secondary | ICD-10-CM | POA: Diagnosis not present

## 2020-09-18 DIAGNOSIS — E1122 Type 2 diabetes mellitus with diabetic chronic kidney disease: Secondary | ICD-10-CM | POA: Diagnosis not present

## 2020-09-18 DIAGNOSIS — E039 Hypothyroidism, unspecified: Secondary | ICD-10-CM | POA: Diagnosis not present

## 2020-09-18 DIAGNOSIS — I480 Paroxysmal atrial fibrillation: Secondary | ICD-10-CM | POA: Diagnosis not present

## 2020-09-18 DIAGNOSIS — M47815 Spondylosis without myelopathy or radiculopathy, thoracolumbar region: Secondary | ICD-10-CM | POA: Diagnosis not present

## 2020-09-18 DIAGNOSIS — I130 Hypertensive heart and chronic kidney disease with heart failure and stage 1 through stage 4 chronic kidney disease, or unspecified chronic kidney disease: Secondary | ICD-10-CM | POA: Diagnosis not present

## 2020-09-22 ENCOUNTER — Other Ambulatory Visit: Payer: Self-pay | Admitting: Internal Medicine

## 2020-09-22 DIAGNOSIS — E1165 Type 2 diabetes mellitus with hyperglycemia: Secondary | ICD-10-CM | POA: Diagnosis not present

## 2020-09-22 DIAGNOSIS — K219 Gastro-esophageal reflux disease without esophagitis: Secondary | ICD-10-CM | POA: Diagnosis not present

## 2020-09-22 DIAGNOSIS — I10 Essential (primary) hypertension: Secondary | ICD-10-CM | POA: Diagnosis not present

## 2020-09-22 DIAGNOSIS — E7800 Pure hypercholesterolemia, unspecified: Secondary | ICD-10-CM | POA: Diagnosis not present

## 2020-09-23 DIAGNOSIS — N179 Acute kidney failure, unspecified: Secondary | ICD-10-CM | POA: Diagnosis not present

## 2020-09-23 DIAGNOSIS — E1122 Type 2 diabetes mellitus with diabetic chronic kidney disease: Secondary | ICD-10-CM | POA: Diagnosis not present

## 2020-09-23 DIAGNOSIS — M47815 Spondylosis without myelopathy or radiculopathy, thoracolumbar region: Secondary | ICD-10-CM | POA: Diagnosis not present

## 2020-09-23 DIAGNOSIS — E039 Hypothyroidism, unspecified: Secondary | ICD-10-CM | POA: Diagnosis not present

## 2020-09-23 DIAGNOSIS — I5022 Chronic systolic (congestive) heart failure: Secondary | ICD-10-CM | POA: Diagnosis not present

## 2020-09-23 DIAGNOSIS — N1832 Chronic kidney disease, stage 3b: Secondary | ICD-10-CM | POA: Diagnosis not present

## 2020-09-23 DIAGNOSIS — M545 Low back pain, unspecified: Secondary | ICD-10-CM | POA: Diagnosis not present

## 2020-09-23 DIAGNOSIS — I480 Paroxysmal atrial fibrillation: Secondary | ICD-10-CM | POA: Diagnosis not present

## 2020-09-23 DIAGNOSIS — I130 Hypertensive heart and chronic kidney disease with heart failure and stage 1 through stage 4 chronic kidney disease, or unspecified chronic kidney disease: Secondary | ICD-10-CM | POA: Diagnosis not present

## 2020-09-26 DIAGNOSIS — Z299 Encounter for prophylactic measures, unspecified: Secondary | ICD-10-CM | POA: Diagnosis not present

## 2020-09-26 DIAGNOSIS — I10 Essential (primary) hypertension: Secondary | ICD-10-CM | POA: Diagnosis not present

## 2020-09-26 DIAGNOSIS — M549 Dorsalgia, unspecified: Secondary | ICD-10-CM | POA: Diagnosis not present

## 2020-09-26 DIAGNOSIS — M545 Low back pain, unspecified: Secondary | ICD-10-CM | POA: Diagnosis not present

## 2020-10-01 ENCOUNTER — Ambulatory Visit (INDEPENDENT_AMBULATORY_CARE_PROVIDER_SITE_OTHER): Payer: Medicare HMO

## 2020-10-01 ENCOUNTER — Encounter: Payer: Self-pay | Admitting: Orthopaedic Surgery

## 2020-10-01 ENCOUNTER — Ambulatory Visit (INDEPENDENT_AMBULATORY_CARE_PROVIDER_SITE_OTHER): Payer: Medicare HMO | Admitting: Orthopaedic Surgery

## 2020-10-01 ENCOUNTER — Other Ambulatory Visit: Payer: Self-pay

## 2020-10-01 VITALS — Ht 68.0 in | Wt 137.0 lb

## 2020-10-01 DIAGNOSIS — M545 Low back pain, unspecified: Secondary | ICD-10-CM | POA: Insufficient documentation

## 2020-10-01 DIAGNOSIS — G8929 Other chronic pain: Secondary | ICD-10-CM

## 2020-10-01 NOTE — Progress Notes (Signed)
Office Visit Note   Patient: Austin Orozco           Date of Birth: 1941/03/15           MRN: [id number] Visit Date: 10/01/2020              Requested by: No referring provider defined for this encounter. PCP: Austin Chroman, MD   Assessment & Plan: Visit Diagnoses:  1. Chronic left-sided low back pain, unspecified whether sciatica present   2. Chronic bilateral low back pain without sciatica     Plan: Austin Orozco is accompanied by his wife and here for evaluation of severe low back pain.  He has been having trouble for about a month.  He is not aware of any specific injury or trauma and not having any buttock or lower extremity pain.  He is not having any numbness or tingling.  Dr. Woody Orozco prescribed tramadol and hydrocodone and he is not sure that it is made much of a difference.  X-rays reveal significant degenerative change in the lower lumbar spine and possibly a compression of T11.  He is hesitant to proceed with any further diagnostic testing but will prescribe a lumbar support.  This was applied and he notes that it is "100% better".  We will monitor his progress and have him return over the next several weeks.  I certainly think an MRI scan would be indicated if he continues to have a problem.  Have him return in 2 weeks  Follow-Up Instructions: Return in about 1 week (around 10/08/2020).   Orders:  Orders Placed This Encounter  Procedures  . XR Lumbar Spine 2-3 Views   No orders of the defined types were placed in this encounter.     Procedures: No procedures performed   Clinical Data: No additional findings.   Subjective: Chief Complaint  Patient presents with  . Lower Back - Pain  Patient presents today for lower back pain. He states that it has been hurting for a month. No known injury. The pain is located just in his lower back, worse on the left side. No pain radiates down either leg. No numbness or tingling. He states that it is a constant pain and cannot get  comfortable. He has been taking Hydrocodone and Tramadol as given by his PCP. He declines x-rays today.   HPI  Review of Systems   Objective: Vital Signs: Ht '5\' 8"'$  (1.727 m)   Wt 137 lb (62.1 kg)   BMI 20.83 kg/m   Physical Exam Constitutional:      Appearance: He is well-developed and well-nourished.  HENT:     Mouth/Throat:     Mouth: Oropharynx is clear and moist.  Eyes:     Extraocular Movements: EOM normal.     Pupils: Pupils are equal, round, and reactive to light.  Pulmonary:     Effort: Pulmonary effort is normal.  Skin:    General: Skin is warm and dry.  Neurological:     Mental Status: He is alert and oriented to person, place, and time.  Psychiatric:        Mood and Affect: Mood and affect normal.        Behavior: Behavior normal.     Ortho Exam awake alert and oriented x3.  Comfortable sitting.  He is in a fair amount of pain with his back and is hard for him to stand.  Straight leg raise is negative.  He does have some percussible tenderness  along the lower lumbar spine and in the paralumbar region.  No specific flank pain.  Motor exam intact Specialty Comments:  No specialty comments available.  Imaging: XR Lumbar Spine 2-3 Views  Result Date: 10/01/2020 Films of the lumbar spine obtained in 2 projections.  There are also limited views of the hips which reveal some mild arthritic changes.  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  There is some compression of T11 difficult to determine this is old or new.  There is a fair amount of osteophyte formation along the entire lumbar spine.  No obvious listhesis.  Considerable degenerative disc disease pain L2 and L3 and diffuse calcification of the abdominal aorta but without obvious aneurysmal dilatation.  Patient is not experiencing any leg pain.    PMFS History: Patient Active Problem List   Diagnosis Date Noted  . Low back pain  10/01/2020  . Syncope 08/29/2020  . AKI (acute kidney injury) ([place]) 08/29/2020  . Chest pain 08/29/2020  . Encephalopathy acute 08/29/2020   History reviewed. No pertinent past medical history.  History reviewed. No pertinent family history.  History reviewed. No pertinent surgical history. Social History   Occupational History  . Not on file  Tobacco Use  . Smoking status: Unknown If Ever Smoked  . Smokeless tobacco: Not on file  Substance and Sexual Activity  . Alcohol use: Not on file  . Drug use: Not on file  . Sexual activity: Not on file

## 2020-10-02 DIAGNOSIS — M545 Low back pain, unspecified: Secondary | ICD-10-CM | POA: Diagnosis not present

## 2020-10-02 DIAGNOSIS — N179 Acute kidney failure, unspecified: Secondary | ICD-10-CM | POA: Diagnosis not present

## 2020-10-02 DIAGNOSIS — E039 Hypothyroidism, unspecified: Secondary | ICD-10-CM | POA: Diagnosis not present

## 2020-10-02 DIAGNOSIS — I5022 Chronic systolic (congestive) heart failure: Secondary | ICD-10-CM | POA: Diagnosis not present

## 2020-10-02 DIAGNOSIS — M47815 Spondylosis without myelopathy or radiculopathy, thoracolumbar region: Secondary | ICD-10-CM | POA: Diagnosis not present

## 2020-10-02 DIAGNOSIS — E1122 Type 2 diabetes mellitus with diabetic chronic kidney disease: Secondary | ICD-10-CM | POA: Diagnosis not present

## 2020-10-02 DIAGNOSIS — I130 Hypertensive heart and chronic kidney disease with heart failure and stage 1 through stage 4 chronic kidney disease, or unspecified chronic kidney disease: Secondary | ICD-10-CM | POA: Diagnosis not present

## 2020-10-02 DIAGNOSIS — N1832 Chronic kidney disease, stage 3b: Secondary | ICD-10-CM | POA: Diagnosis not present

## 2020-10-02 DIAGNOSIS — I480 Paroxysmal atrial fibrillation: Secondary | ICD-10-CM | POA: Diagnosis not present

## 2020-10-06 DIAGNOSIS — E039 Hypothyroidism, unspecified: Secondary | ICD-10-CM | POA: Diagnosis not present

## 2020-10-06 DIAGNOSIS — I130 Hypertensive heart and chronic kidney disease with heart failure and stage 1 through stage 4 chronic kidney disease, or unspecified chronic kidney disease: Secondary | ICD-10-CM | POA: Diagnosis not present

## 2020-10-06 DIAGNOSIS — M545 Low back pain, unspecified: Secondary | ICD-10-CM | POA: Diagnosis not present

## 2020-10-06 DIAGNOSIS — M47815 Spondylosis without myelopathy or radiculopathy, thoracolumbar region: Secondary | ICD-10-CM | POA: Diagnosis not present

## 2020-10-06 DIAGNOSIS — I5022 Chronic systolic (congestive) heart failure: Secondary | ICD-10-CM | POA: Diagnosis not present

## 2020-10-06 DIAGNOSIS — E1122 Type 2 diabetes mellitus with diabetic chronic kidney disease: Secondary | ICD-10-CM | POA: Diagnosis not present

## 2020-10-06 DIAGNOSIS — N1832 Chronic kidney disease, stage 3b: Secondary | ICD-10-CM | POA: Diagnosis not present

## 2020-10-06 DIAGNOSIS — I480 Paroxysmal atrial fibrillation: Secondary | ICD-10-CM | POA: Diagnosis not present

## 2020-10-06 DIAGNOSIS — N179 Acute kidney failure, unspecified: Secondary | ICD-10-CM | POA: Diagnosis not present

## 2020-10-14 DIAGNOSIS — E039 Hypothyroidism, unspecified: Secondary | ICD-10-CM | POA: Diagnosis not present

## 2020-10-14 DIAGNOSIS — I5022 Chronic systolic (congestive) heart failure: Secondary | ICD-10-CM | POA: Diagnosis not present

## 2020-10-14 DIAGNOSIS — M47815 Spondylosis without myelopathy or radiculopathy, thoracolumbar region: Secondary | ICD-10-CM | POA: Diagnosis not present

## 2020-10-14 DIAGNOSIS — E1122 Type 2 diabetes mellitus with diabetic chronic kidney disease: Secondary | ICD-10-CM | POA: Diagnosis not present

## 2020-10-14 DIAGNOSIS — M545 Low back pain, unspecified: Secondary | ICD-10-CM | POA: Diagnosis not present

## 2020-10-14 DIAGNOSIS — I480 Paroxysmal atrial fibrillation: Secondary | ICD-10-CM | POA: Diagnosis not present

## 2020-10-14 DIAGNOSIS — N179 Acute kidney failure, unspecified: Secondary | ICD-10-CM | POA: Diagnosis not present

## 2020-10-14 DIAGNOSIS — I130 Hypertensive heart and chronic kidney disease with heart failure and stage 1 through stage 4 chronic kidney disease, or unspecified chronic kidney disease: Secondary | ICD-10-CM | POA: Diagnosis not present

## 2020-10-14 DIAGNOSIS — N1832 Chronic kidney disease, stage 3b: Secondary | ICD-10-CM | POA: Diagnosis not present

## 2020-10-15 ENCOUNTER — Encounter: Payer: Self-pay | Admitting: Orthopaedic Surgery

## 2020-10-15 ENCOUNTER — Ambulatory Visit (INDEPENDENT_AMBULATORY_CARE_PROVIDER_SITE_OTHER): Payer: Medicare HMO | Admitting: Orthopaedic Surgery

## 2020-10-15 ENCOUNTER — Other Ambulatory Visit: Payer: Self-pay

## 2020-10-15 VITALS — Ht 68.0 in | Wt 137.0 lb

## 2020-10-15 DIAGNOSIS — M545 Low back pain, unspecified: Secondary | ICD-10-CM | POA: Diagnosis not present

## 2020-10-15 DIAGNOSIS — G8929 Other chronic pain: Secondary | ICD-10-CM

## 2020-10-15 NOTE — Progress Notes (Signed)
Office Visit Note   Patient: Austin Orozco           Date of Birth: 1941/05/23           MRN: [id number] Visit Date: 10/15/2020              Requested by: Glenda Chroman, MD [place],  [place] [address] PCP: Glenda Chroman, MD   Assessment & Plan: Visit Diagnoses:  1. Chronic bilateral low back pain without sciatica     Plan: Mr. Reichmann relates that he is still having significant low back pain.  The pain is diffuse and would not referred to either buttock or either lower extremity.  When he was seen 2 weeks ago the back support helped tremendously but he notes that over the past week or so pain has been miserable.  I think it is worth obtaining an MRI scan at this point.  He could have a compression fracture or some bony pathology.  Would also ask them to scan the sacrum to be sure he does not have an insufficiency fracture  Follow-Up Instructions: Return After MRI scan lumbar spine.   Orders:  Orders Placed This Encounter  Procedures  . MR Lumbar Spine w/o contrast   No orders of the defined types were placed in this encounter.     Procedures: No procedures performed   Clinical Data: No additional findings.   Subjective: Chief Complaint  Patient presents with  . Lower Back - Follow-up  Patient presents today for lower back pain follow up. He was here two weeks and was given a lumbar support, which seemed to help at the time. Since his visit he states that his back pain has returned. He has severe discomfort in his lower back when walking and moving around. He has some relief with sitting, but states that the brace is not helping at all now. He is taking oxycodone and Tramadol for pain from his primary care provider.  Still not having any referred pain to either lower extremity  HPI  Review of Systems   Objective: Vital Signs: Ht '5\' 8"'$  (1.727 m)   Wt 137 lb (62.1 kg)   BMI 20.83 kg/m   Physical Exam Constitutional:      Appearance: He is well-developed.   Eyes:     Pupils: Pupils are equal, round, and reactive to light.  Pulmonary:     Effort: Pulmonary effort is normal.  Skin:    General: Skin is warm and dry.  Neurological:     Mental Status: He is alert and oriented to person, place, and time.  Psychiatric:        Behavior: Behavior normal.     Ortho Exam awake and alert.  Seems to be in a little less distressed than when he was here 2 weeks ago but still having considerable pain in his lumbar spine and in the paralumbar region.  No flank pain.  Pain extends along the entire lumbar spine even to some extent into the sacrum.  Straight leg raise negative.  Painless range of motion both hips  Specialty Comments:  No specialty comments available.  Imaging: No results found.   PMFS History: Patient Active Problem List   Diagnosis Date Noted  . Low back pain 10/01/2020  . Syncope 08/29/2020  . AKI (acute kidney injury) ([place]) 08/29/2020  . Chest pain 08/29/2020  . Encephalopathy acute 08/29/2020   History reviewed. No pertinent past medical history.  History reviewed. No pertinent  family history.  History reviewed. No pertinent surgical history. Social History   Occupational History  . Not on file  Tobacco Use  . Smoking status: Unknown If Ever Smoked  . Smokeless tobacco: Not on file  Substance and Sexual Activity  . Alcohol use: Not on file  . Drug use: Not on file  . Sexual activity: Not on file

## 2020-10-22 DIAGNOSIS — I5022 Chronic systolic (congestive) heart failure: Secondary | ICD-10-CM | POA: Diagnosis not present

## 2020-10-22 DIAGNOSIS — I480 Paroxysmal atrial fibrillation: Secondary | ICD-10-CM | POA: Diagnosis not present

## 2020-10-22 DIAGNOSIS — N1832 Chronic kidney disease, stage 3b: Secondary | ICD-10-CM | POA: Diagnosis not present

## 2020-10-22 DIAGNOSIS — E039 Hypothyroidism, unspecified: Secondary | ICD-10-CM | POA: Diagnosis not present

## 2020-10-22 DIAGNOSIS — E1165 Type 2 diabetes mellitus with hyperglycemia: Secondary | ICD-10-CM | POA: Diagnosis not present

## 2020-10-22 DIAGNOSIS — M47815 Spondylosis without myelopathy or radiculopathy, thoracolumbar region: Secondary | ICD-10-CM | POA: Diagnosis not present

## 2020-10-22 DIAGNOSIS — N179 Acute kidney failure, unspecified: Secondary | ICD-10-CM | POA: Diagnosis not present

## 2020-10-22 DIAGNOSIS — K219 Gastro-esophageal reflux disease without esophagitis: Secondary | ICD-10-CM | POA: Diagnosis not present

## 2020-10-22 DIAGNOSIS — I130 Hypertensive heart and chronic kidney disease with heart failure and stage 1 through stage 4 chronic kidney disease, or unspecified chronic kidney disease: Secondary | ICD-10-CM | POA: Diagnosis not present

## 2020-10-22 DIAGNOSIS — E7800 Pure hypercholesterolemia, unspecified: Secondary | ICD-10-CM | POA: Diagnosis not present

## 2020-10-22 DIAGNOSIS — E1122 Type 2 diabetes mellitus with diabetic chronic kidney disease: Secondary | ICD-10-CM | POA: Diagnosis not present

## 2020-10-22 DIAGNOSIS — J309 Allergic rhinitis, unspecified: Secondary | ICD-10-CM | POA: Diagnosis not present

## 2020-10-22 DIAGNOSIS — M545 Low back pain, unspecified: Secondary | ICD-10-CM | POA: Diagnosis not present

## 2020-10-23 DIAGNOSIS — I10 Essential (primary) hypertension: Secondary | ICD-10-CM | POA: Diagnosis not present

## 2020-10-27 DIAGNOSIS — M545 Low back pain, unspecified: Secondary | ICD-10-CM | POA: Diagnosis not present

## 2020-10-27 DIAGNOSIS — M9972 Connective tissue and disc stenosis of intervertebral foramina of thoracic region: Secondary | ICD-10-CM | POA: Diagnosis not present

## 2020-10-30 ENCOUNTER — Telehealth: Payer: Self-pay

## 2020-10-30 NOTE — Telephone Encounter (Signed)
Patient called the [facility] office upset today because nobody had called him with his MRI L-Spine results. He had his MRI scan done 3 days ago at [facility]. Results are in care everywhere. I called patient and explained that Dr.Whitfield is out of the office and will return next Tuesday. He will call him then with the results. Patient understood. Call back number is [phone]. Thanks!

## 2020-11-03 DIAGNOSIS — N184 Chronic kidney disease, stage 4 (severe): Secondary | ICD-10-CM | POA: Diagnosis not present

## 2020-11-03 DIAGNOSIS — E1122 Type 2 diabetes mellitus with diabetic chronic kidney disease: Secondary | ICD-10-CM | POA: Diagnosis not present

## 2020-11-03 DIAGNOSIS — I10 Essential (primary) hypertension: Secondary | ICD-10-CM | POA: Diagnosis not present

## 2020-11-03 DIAGNOSIS — E1165 Type 2 diabetes mellitus with hyperglycemia: Secondary | ICD-10-CM | POA: Diagnosis not present

## 2020-11-03 DIAGNOSIS — Z299 Encounter for prophylactic measures, unspecified: Secondary | ICD-10-CM | POA: Diagnosis not present

## 2020-11-03 DIAGNOSIS — R634 Abnormal weight loss: Secondary | ICD-10-CM | POA: Diagnosis not present

## 2020-11-03 NOTE — Telephone Encounter (Signed)
Please find MRI scan from [facility] and I will call tomorrow-thanks

## 2020-11-03 NOTE — Telephone Encounter (Signed)
It is in your folder.

## 2020-11-05 NOTE — Telephone Encounter (Signed)
Called with results and discussed treatment options-suggest ESI-pt wishes to think about it

## 2020-12-17 DIAGNOSIS — E119 Type 2 diabetes mellitus without complications: Secondary | ICD-10-CM | POA: Diagnosis not present

## 2020-12-17 DIAGNOSIS — I10 Essential (primary) hypertension: Secondary | ICD-10-CM | POA: Diagnosis not present

## 2020-12-17 DIAGNOSIS — G319 Degenerative disease of nervous system, unspecified: Secondary | ICD-10-CM | POA: Diagnosis not present

## 2020-12-17 DIAGNOSIS — I6782 Cerebral ischemia: Secondary | ICD-10-CM | POA: Diagnosis not present

## 2020-12-17 DIAGNOSIS — R451 Restlessness and agitation: Secondary | ICD-10-CM | POA: Diagnosis not present

## 2020-12-17 DIAGNOSIS — F4325 Adjustment disorder with mixed disturbance of emotions and conduct: Secondary | ICD-10-CM | POA: Diagnosis not present

## 2020-12-17 DIAGNOSIS — R4182 Altered mental status, unspecified: Secondary | ICD-10-CM | POA: Diagnosis not present

## 2020-12-17 DIAGNOSIS — F10929 Alcohol use, unspecified with intoxication, unspecified: Secondary | ICD-10-CM | POA: Diagnosis not present

## 2020-12-17 DIAGNOSIS — Z79899 Other long term (current) drug therapy: Secondary | ICD-10-CM | POA: Diagnosis not present

## 2020-12-17 DIAGNOSIS — Z634 Disappearance and death of family member: Secondary | ICD-10-CM | POA: Diagnosis not present

## 2020-12-17 DIAGNOSIS — R45851 Suicidal ideations: Secondary | ICD-10-CM | POA: Diagnosis not present

## 2020-12-22 DIAGNOSIS — I10 Essential (primary) hypertension: Secondary | ICD-10-CM | POA: Diagnosis not present

## 2020-12-22 DIAGNOSIS — E1165 Type 2 diabetes mellitus with hyperglycemia: Secondary | ICD-10-CM | POA: Diagnosis not present

## 2020-12-22 DIAGNOSIS — K219 Gastro-esophageal reflux disease without esophagitis: Secondary | ICD-10-CM | POA: Diagnosis not present

## 2020-12-22 DIAGNOSIS — N183 Chronic kidney disease, stage 3 unspecified: Secondary | ICD-10-CM | POA: Diagnosis not present

## 2020-12-24 DIAGNOSIS — F989 Unspecified behavioral and emotional disorders with onset usually occurring in childhood and adolescence: Secondary | ICD-10-CM | POA: Diagnosis not present

## 2020-12-24 DIAGNOSIS — Z79899 Other long term (current) drug therapy: Secondary | ICD-10-CM | POA: Diagnosis not present

## 2020-12-24 DIAGNOSIS — R457 State of emotional shock and stress, unspecified: Secondary | ICD-10-CM | POA: Diagnosis not present

## 2020-12-24 DIAGNOSIS — F32A Depression, unspecified: Secondary | ICD-10-CM | POA: Diagnosis not present

## 2020-12-24 DIAGNOSIS — F29 Unspecified psychosis not due to a substance or known physiological condition: Secondary | ICD-10-CM | POA: Diagnosis not present

## 2020-12-24 DIAGNOSIS — I4510 Unspecified right bundle-branch block: Secondary | ICD-10-CM | POA: Diagnosis not present

## 2020-12-24 DIAGNOSIS — Z7984 Long term (current) use of oral hypoglycemic drugs: Secondary | ICD-10-CM | POA: Diagnosis not present

## 2021-01-22 DIAGNOSIS — E1165 Type 2 diabetes mellitus with hyperglycemia: Secondary | ICD-10-CM | POA: Diagnosis not present

## 2021-01-22 DIAGNOSIS — N183 Chronic kidney disease, stage 3 unspecified: Secondary | ICD-10-CM | POA: Diagnosis not present

## 2021-01-22 DIAGNOSIS — I10 Essential (primary) hypertension: Secondary | ICD-10-CM | POA: Diagnosis not present

## 2021-01-22 DIAGNOSIS — K219 Gastro-esophageal reflux disease without esophagitis: Secondary | ICD-10-CM | POA: Diagnosis not present

## 2021-01-26 DIAGNOSIS — Z20822 Contact with and (suspected) exposure to covid-19: Secondary | ICD-10-CM | POA: Diagnosis not present

## 2021-01-26 DIAGNOSIS — I4891 Unspecified atrial fibrillation: Secondary | ICD-10-CM | POA: Diagnosis not present

## 2021-01-26 DIAGNOSIS — E860 Dehydration: Secondary | ICD-10-CM | POA: Diagnosis not present

## 2021-01-26 DIAGNOSIS — R410 Disorientation, unspecified: Secondary | ICD-10-CM | POA: Diagnosis not present

## 2021-01-26 DIAGNOSIS — F0390 Unspecified dementia without behavioral disturbance: Secondary | ICD-10-CM | POA: Diagnosis not present

## 2021-01-26 DIAGNOSIS — R531 Weakness: Secondary | ICD-10-CM | POA: Diagnosis not present

## 2021-01-26 DIAGNOSIS — E119 Type 2 diabetes mellitus without complications: Secondary | ICD-10-CM | POA: Diagnosis not present

## 2021-01-26 DIAGNOSIS — R404 Transient alteration of awareness: Secondary | ICD-10-CM | POA: Diagnosis not present

## 2021-01-26 DIAGNOSIS — R2689 Other abnormalities of gait and mobility: Secondary | ICD-10-CM | POA: Diagnosis not present

## 2021-01-26 DIAGNOSIS — I517 Cardiomegaly: Secondary | ICD-10-CM | POA: Diagnosis not present

## 2021-01-26 DIAGNOSIS — F29 Unspecified psychosis not due to a substance or known physiological condition: Secondary | ICD-10-CM | POA: Diagnosis not present

## 2021-01-26 DIAGNOSIS — D72829 Elevated white blood cell count, unspecified: Secondary | ICD-10-CM | POA: Diagnosis not present

## 2021-01-26 DIAGNOSIS — E869 Volume depletion, unspecified: Secondary | ICD-10-CM | POA: Diagnosis not present

## 2021-01-26 DIAGNOSIS — Z634 Disappearance and death of family member: Secondary | ICD-10-CM | POA: Diagnosis not present

## 2021-01-26 DIAGNOSIS — Z79899 Other long term (current) drug therapy: Secondary | ICD-10-CM | POA: Diagnosis not present

## 2021-01-26 DIAGNOSIS — F0391 Unspecified dementia with behavioral disturbance: Secondary | ICD-10-CM | POA: Diagnosis not present

## 2021-01-26 DIAGNOSIS — R4182 Altered mental status, unspecified: Secondary | ICD-10-CM | POA: Diagnosis not present

## 2021-01-27 DIAGNOSIS — E860 Dehydration: Secondary | ICD-10-CM | POA: Diagnosis not present

## 2021-01-27 DIAGNOSIS — Z8679 Personal history of other diseases of the circulatory system: Secondary | ICD-10-CM | POA: Diagnosis not present

## 2021-01-27 DIAGNOSIS — E119 Type 2 diabetes mellitus without complications: Secondary | ICD-10-CM | POA: Diagnosis not present

## 2021-02-03 DIAGNOSIS — Z09 Encounter for follow-up examination after completed treatment for conditions other than malignant neoplasm: Secondary | ICD-10-CM | POA: Diagnosis not present

## 2021-02-03 DIAGNOSIS — E860 Dehydration: Secondary | ICD-10-CM | POA: Diagnosis not present

## 2021-02-03 DIAGNOSIS — F32A Depression, unspecified: Secondary | ICD-10-CM | POA: Diagnosis not present

## 2021-02-03 DIAGNOSIS — I10 Essential (primary) hypertension: Secondary | ICD-10-CM | POA: Diagnosis not present

## 2021-02-03 DIAGNOSIS — Z299 Encounter for prophylactic measures, unspecified: Secondary | ICD-10-CM | POA: Diagnosis not present

## 2021-02-10 DIAGNOSIS — Z0000 Encounter for general adult medical examination without abnormal findings: Secondary | ICD-10-CM | POA: Diagnosis not present

## 2021-02-10 DIAGNOSIS — Z681 Body mass index (BMI) 19 or less, adult: Secondary | ICD-10-CM | POA: Diagnosis not present

## 2021-02-10 DIAGNOSIS — Z1331 Encounter for screening for depression: Secondary | ICD-10-CM | POA: Diagnosis not present

## 2021-02-10 DIAGNOSIS — Z1339 Encounter for screening examination for other mental health and behavioral disorders: Secondary | ICD-10-CM | POA: Diagnosis not present

## 2021-02-10 DIAGNOSIS — Z299 Encounter for prophylactic measures, unspecified: Secondary | ICD-10-CM | POA: Diagnosis not present

## 2021-02-10 DIAGNOSIS — Z7189 Other specified counseling: Secondary | ICD-10-CM | POA: Diagnosis not present

## 2021-02-10 DIAGNOSIS — E1165 Type 2 diabetes mellitus with hyperglycemia: Secondary | ICD-10-CM | POA: Diagnosis not present

## 2021-02-10 DIAGNOSIS — F339 Major depressive disorder, recurrent, unspecified: Secondary | ICD-10-CM | POA: Diagnosis not present

## 2021-02-10 DIAGNOSIS — Z789 Other specified health status: Secondary | ICD-10-CM | POA: Diagnosis not present

## 2021-02-19 DIAGNOSIS — Z515 Encounter for palliative care: Secondary | ICD-10-CM | POA: Diagnosis not present

## 2021-02-19 DIAGNOSIS — G3184 Mild cognitive impairment, so stated: Secondary | ICD-10-CM | POA: Diagnosis not present

## 2021-02-22 DIAGNOSIS — K219 Gastro-esophageal reflux disease without esophagitis: Secondary | ICD-10-CM | POA: Diagnosis not present

## 2021-02-22 DIAGNOSIS — N183 Chronic kidney disease, stage 3 unspecified: Secondary | ICD-10-CM | POA: Diagnosis not present

## 2021-02-22 DIAGNOSIS — I10 Essential (primary) hypertension: Secondary | ICD-10-CM | POA: Diagnosis not present

## 2021-02-22 DIAGNOSIS — E1165 Type 2 diabetes mellitus with hyperglycemia: Secondary | ICD-10-CM | POA: Diagnosis not present

## 2021-03-20 DIAGNOSIS — Z515 Encounter for palliative care: Secondary | ICD-10-CM | POA: Diagnosis not present

## 2021-03-20 DIAGNOSIS — F0150 Vascular dementia without behavioral disturbance: Secondary | ICD-10-CM | POA: Diagnosis not present

## 2021-03-25 DIAGNOSIS — K219 Gastro-esophageal reflux disease without esophagitis: Secondary | ICD-10-CM | POA: Diagnosis not present

## 2021-03-25 DIAGNOSIS — N183 Chronic kidney disease, stage 3 unspecified: Secondary | ICD-10-CM | POA: Diagnosis not present

## 2021-03-25 DIAGNOSIS — E1165 Type 2 diabetes mellitus with hyperglycemia: Secondary | ICD-10-CM | POA: Diagnosis not present

## 2021-03-25 DIAGNOSIS — I10 Essential (primary) hypertension: Secondary | ICD-10-CM | POA: Diagnosis not present

## 2021-05-11 DIAGNOSIS — R627 Adult failure to thrive: Secondary | ICD-10-CM | POA: Diagnosis not present

## 2021-05-11 DIAGNOSIS — E11621 Type 2 diabetes mellitus with foot ulcer: Secondary | ICD-10-CM | POA: Diagnosis not present

## 2021-05-11 DIAGNOSIS — L89311 Pressure ulcer of right buttock, stage 1: Secondary | ICD-10-CM | POA: Diagnosis not present

## 2021-05-11 DIAGNOSIS — Z789 Other specified health status: Secondary | ICD-10-CM | POA: Diagnosis not present

## 2021-05-11 DIAGNOSIS — I4891 Unspecified atrial fibrillation: Secondary | ICD-10-CM | POA: Diagnosis not present

## 2021-05-11 DIAGNOSIS — N179 Acute kidney failure, unspecified: Secondary | ICD-10-CM | POA: Diagnosis not present

## 2021-05-11 DIAGNOSIS — L89301 Pressure ulcer of unspecified buttock, stage 1: Secondary | ICD-10-CM | POA: Diagnosis not present

## 2021-05-11 DIAGNOSIS — M7989 Other specified soft tissue disorders: Secondary | ICD-10-CM | POA: Diagnosis not present

## 2021-05-11 DIAGNOSIS — E872 Acidosis, unspecified: Secondary | ICD-10-CM | POA: Diagnosis not present

## 2021-05-11 DIAGNOSIS — Z20822 Contact with and (suspected) exposure to covid-19: Secondary | ICD-10-CM | POA: Diagnosis not present

## 2021-05-11 DIAGNOSIS — E860 Dehydration: Secondary | ICD-10-CM | POA: Diagnosis not present

## 2021-05-11 DIAGNOSIS — F0390 Unspecified dementia without behavioral disturbance: Secondary | ICD-10-CM | POA: Diagnosis not present

## 2021-05-11 DIAGNOSIS — L97519 Non-pressure chronic ulcer of other part of right foot with unspecified severity: Secondary | ICD-10-CM | POA: Diagnosis not present

## 2021-05-11 DIAGNOSIS — R4182 Altered mental status, unspecified: Secondary | ICD-10-CM | POA: Diagnosis not present

## 2021-05-11 DIAGNOSIS — I959 Hypotension, unspecified: Secondary | ICD-10-CM | POA: Diagnosis not present

## 2021-05-12 DIAGNOSIS — E860 Dehydration: Secondary | ICD-10-CM | POA: Diagnosis not present

## 2021-05-12 DIAGNOSIS — L84 Corns and callosities: Secondary | ICD-10-CM | POA: Diagnosis not present

## 2021-05-12 DIAGNOSIS — N179 Acute kidney failure, unspecified: Secondary | ICD-10-CM | POA: Diagnosis not present

## 2021-05-12 DIAGNOSIS — R4182 Altered mental status, unspecified: Secondary | ICD-10-CM | POA: Diagnosis not present

## 2021-05-12 DIAGNOSIS — Z789 Other specified health status: Secondary | ICD-10-CM | POA: Diagnosis not present

## 2021-05-13 DIAGNOSIS — L97529 Non-pressure chronic ulcer of other part of left foot with unspecified severity: Secondary | ICD-10-CM | POA: Diagnosis not present

## 2021-05-13 DIAGNOSIS — L84 Corns and callosities: Secondary | ICD-10-CM | POA: Diagnosis not present

## 2021-05-13 DIAGNOSIS — E11621 Type 2 diabetes mellitus with foot ulcer: Secondary | ICD-10-CM | POA: Diagnosis not present

## 2021-05-14 DIAGNOSIS — E11621 Type 2 diabetes mellitus with foot ulcer: Secondary | ICD-10-CM | POA: Diagnosis not present

## 2021-05-14 DIAGNOSIS — N179 Acute kidney failure, unspecified: Secondary | ICD-10-CM | POA: Diagnosis not present

## 2021-05-14 DIAGNOSIS — L97529 Non-pressure chronic ulcer of other part of left foot with unspecified severity: Secondary | ICD-10-CM | POA: Diagnosis not present

## 2021-05-14 DIAGNOSIS — E860 Dehydration: Secondary | ICD-10-CM | POA: Diagnosis not present

## 2021-05-14 DIAGNOSIS — Z789 Other specified health status: Secondary | ICD-10-CM | POA: Diagnosis not present

## 2021-05-14 DIAGNOSIS — M10061 Idiopathic gout, right knee: Secondary | ICD-10-CM | POA: Diagnosis not present

## 2021-05-14 DIAGNOSIS — R4182 Altered mental status, unspecified: Secondary | ICD-10-CM | POA: Diagnosis not present

## 2021-05-18 DIAGNOSIS — E1165 Type 2 diabetes mellitus with hyperglycemia: Secondary | ICD-10-CM | POA: Diagnosis not present

## 2021-05-18 DIAGNOSIS — Z299 Encounter for prophylactic measures, unspecified: Secondary | ICD-10-CM | POA: Diagnosis not present

## 2021-05-18 DIAGNOSIS — L97509 Non-pressure chronic ulcer of other part of unspecified foot with unspecified severity: Secondary | ICD-10-CM | POA: Diagnosis not present

## 2021-05-18 DIAGNOSIS — I10 Essential (primary) hypertension: Secondary | ICD-10-CM | POA: Diagnosis not present

## 2021-05-18 DIAGNOSIS — E11621 Type 2 diabetes mellitus with foot ulcer: Secondary | ICD-10-CM | POA: Diagnosis not present

## 2021-05-18 DIAGNOSIS — Z2821 Immunization not carried out because of patient refusal: Secondary | ICD-10-CM | POA: Diagnosis not present

## 2021-05-22 DIAGNOSIS — H9190 Unspecified hearing loss, unspecified ear: Secondary | ICD-10-CM | POA: Diagnosis not present

## 2021-05-22 DIAGNOSIS — M7989 Other specified soft tissue disorders: Secondary | ICD-10-CM | POA: Diagnosis not present

## 2021-05-22 DIAGNOSIS — I82611 Acute embolism and thrombosis of superficial veins of right upper extremity: Secondary | ICD-10-CM | POA: Diagnosis not present

## 2021-05-22 DIAGNOSIS — B9561 Methicillin susceptible Staphylococcus aureus infection as the cause of diseases classified elsewhere: Secondary | ICD-10-CM | POA: Diagnosis not present

## 2021-05-22 DIAGNOSIS — R5381 Other malaise: Secondary | ICD-10-CM | POA: Diagnosis not present

## 2021-05-22 DIAGNOSIS — Z7901 Long term (current) use of anticoagulants: Secondary | ICD-10-CM | POA: Diagnosis not present

## 2021-05-22 DIAGNOSIS — M109 Gout, unspecified: Secondary | ICD-10-CM | POA: Diagnosis not present

## 2021-05-22 DIAGNOSIS — I4891 Unspecified atrial fibrillation: Secondary | ICD-10-CM | POA: Diagnosis not present

## 2021-05-22 DIAGNOSIS — N179 Acute kidney failure, unspecified: Secondary | ICD-10-CM | POA: Diagnosis not present

## 2021-05-22 DIAGNOSIS — I480 Paroxysmal atrial fibrillation: Secondary | ICD-10-CM | POA: Diagnosis not present

## 2021-05-22 DIAGNOSIS — L97523 Non-pressure chronic ulcer of other part of left foot with necrosis of muscle: Secondary | ICD-10-CM | POA: Diagnosis not present

## 2021-05-22 DIAGNOSIS — R936 Abnormal findings on diagnostic imaging of limbs: Secondary | ICD-10-CM | POA: Diagnosis not present

## 2021-05-22 DIAGNOSIS — F32A Depression, unspecified: Secondary | ICD-10-CM | POA: Diagnosis not present

## 2021-05-22 DIAGNOSIS — M86179 Other acute osteomyelitis, unspecified ankle and foot: Secondary | ICD-10-CM | POA: Diagnosis not present

## 2021-05-22 DIAGNOSIS — L97514 Non-pressure chronic ulcer of other part of right foot with necrosis of bone: Secondary | ICD-10-CM | POA: Diagnosis not present

## 2021-05-22 DIAGNOSIS — H9193 Unspecified hearing loss, bilateral: Secondary | ICD-10-CM | POA: Diagnosis not present

## 2021-05-22 DIAGNOSIS — R279 Unspecified lack of coordination: Secondary | ICD-10-CM | POA: Diagnosis not present

## 2021-05-22 DIAGNOSIS — Z79899 Other long term (current) drug therapy: Secondary | ICD-10-CM | POA: Diagnosis not present

## 2021-05-22 DIAGNOSIS — M87172 Osteonecrosis due to drugs, left ankle: Secondary | ICD-10-CM | POA: Diagnosis not present

## 2021-05-22 DIAGNOSIS — Z634 Disappearance and death of family member: Secondary | ICD-10-CM | POA: Diagnosis not present

## 2021-05-22 DIAGNOSIS — R52 Pain, unspecified: Secondary | ICD-10-CM | POA: Diagnosis not present

## 2021-05-22 DIAGNOSIS — R609 Edema, unspecified: Secondary | ICD-10-CM | POA: Diagnosis not present

## 2021-05-22 DIAGNOSIS — M869 Osteomyelitis, unspecified: Secondary | ICD-10-CM | POA: Diagnosis not present

## 2021-05-22 DIAGNOSIS — Z7401 Bed confinement status: Secondary | ICD-10-CM | POA: Diagnosis not present

## 2021-05-22 DIAGNOSIS — E441 Mild protein-calorie malnutrition: Secondary | ICD-10-CM | POA: Diagnosis not present

## 2021-05-22 DIAGNOSIS — I10 Essential (primary) hypertension: Secondary | ICD-10-CM | POA: Diagnosis not present

## 2021-05-22 DIAGNOSIS — N183 Chronic kidney disease, stage 3 unspecified: Secondary | ICD-10-CM | POA: Diagnosis not present

## 2021-05-22 DIAGNOSIS — I82431 Acute embolism and thrombosis of right popliteal vein: Secondary | ICD-10-CM | POA: Diagnosis not present

## 2021-05-22 DIAGNOSIS — E119 Type 2 diabetes mellitus without complications: Secondary | ICD-10-CM | POA: Diagnosis not present

## 2021-05-22 DIAGNOSIS — E871 Hypo-osmolality and hyponatremia: Secondary | ICD-10-CM | POA: Diagnosis not present

## 2021-05-22 DIAGNOSIS — L02612 Cutaneous abscess of left foot: Secondary | ICD-10-CM | POA: Diagnosis not present

## 2021-05-22 DIAGNOSIS — R2991 Unspecified symptoms and signs involving the musculoskeletal system: Secondary | ICD-10-CM | POA: Diagnosis not present

## 2021-05-22 DIAGNOSIS — F0390 Unspecified dementia without behavioral disturbance: Secondary | ICD-10-CM | POA: Diagnosis not present

## 2021-05-22 DIAGNOSIS — M86172 Other acute osteomyelitis, left ankle and foot: Secondary | ICD-10-CM | POA: Diagnosis not present

## 2021-05-22 DIAGNOSIS — R2689 Other abnormalities of gait and mobility: Secondary | ICD-10-CM | POA: Diagnosis not present

## 2021-05-22 DIAGNOSIS — I82411 Acute embolism and thrombosis of right femoral vein: Secondary | ICD-10-CM | POA: Diagnosis not present

## 2021-05-22 DIAGNOSIS — E1169 Type 2 diabetes mellitus with other specified complication: Secondary | ICD-10-CM | POA: Diagnosis not present

## 2021-05-22 DIAGNOSIS — D72829 Elevated white blood cell count, unspecified: Secondary | ICD-10-CM | POA: Diagnosis not present

## 2021-05-22 DIAGNOSIS — M79673 Pain in unspecified foot: Secondary | ICD-10-CM | POA: Diagnosis not present

## 2021-05-22 DIAGNOSIS — I70202 Unspecified atherosclerosis of native arteries of extremities, left leg: Secondary | ICD-10-CM | POA: Diagnosis not present

## 2021-05-22 DIAGNOSIS — R7989 Other specified abnormal findings of blood chemistry: Secondary | ICD-10-CM | POA: Diagnosis not present

## 2021-05-22 DIAGNOSIS — E11621 Type 2 diabetes mellitus with foot ulcer: Secondary | ICD-10-CM | POA: Diagnosis not present

## 2021-05-22 DIAGNOSIS — Z8679 Personal history of other diseases of the circulatory system: Secondary | ICD-10-CM | POA: Diagnosis not present

## 2021-05-22 DIAGNOSIS — I82401 Acute embolism and thrombosis of unspecified deep veins of right lower extremity: Secondary | ICD-10-CM | POA: Diagnosis not present

## 2021-05-22 DIAGNOSIS — E1122 Type 2 diabetes mellitus with diabetic chronic kidney disease: Secondary | ICD-10-CM | POA: Diagnosis not present

## 2021-05-22 DIAGNOSIS — Z792 Long term (current) use of antibiotics: Secondary | ICD-10-CM | POA: Diagnosis not present

## 2021-05-22 DIAGNOSIS — R41841 Cognitive communication deficit: Secondary | ICD-10-CM | POA: Diagnosis not present

## 2021-05-22 DIAGNOSIS — I808 Phlebitis and thrombophlebitis of other sites: Secondary | ICD-10-CM | POA: Diagnosis not present

## 2021-05-22 DIAGNOSIS — M6281 Muscle weakness (generalized): Secondary | ICD-10-CM | POA: Diagnosis not present

## 2021-05-22 DIAGNOSIS — L97529 Non-pressure chronic ulcer of other part of left foot with unspecified severity: Secondary | ICD-10-CM | POA: Diagnosis not present

## 2021-05-25 DIAGNOSIS — M109 Gout, unspecified: Secondary | ICD-10-CM | POA: Diagnosis not present

## 2021-05-25 DIAGNOSIS — F32A Depression, unspecified: Secondary | ICD-10-CM | POA: Diagnosis not present

## 2021-06-08 DIAGNOSIS — R279 Unspecified lack of coordination: Secondary | ICD-10-CM | POA: Diagnosis not present

## 2021-06-08 DIAGNOSIS — I82411 Acute embolism and thrombosis of right femoral vein: Secondary | ICD-10-CM | POA: Diagnosis not present

## 2021-06-08 DIAGNOSIS — M1990 Unspecified osteoarthritis, unspecified site: Secondary | ICD-10-CM | POA: Diagnosis not present

## 2021-06-08 DIAGNOSIS — L089 Local infection of the skin and subcutaneous tissue, unspecified: Secondary | ICD-10-CM | POA: Diagnosis not present

## 2021-06-08 DIAGNOSIS — M86179 Other acute osteomyelitis, unspecified ankle and foot: Secondary | ICD-10-CM | POA: Diagnosis not present

## 2021-06-08 DIAGNOSIS — E11621 Type 2 diabetes mellitus with foot ulcer: Secondary | ICD-10-CM | POA: Diagnosis not present

## 2021-06-08 DIAGNOSIS — Z7401 Bed confinement status: Secondary | ICD-10-CM | POA: Diagnosis not present

## 2021-06-08 DIAGNOSIS — I152 Hypertension secondary to endocrine disorders: Secondary | ICD-10-CM | POA: Diagnosis not present

## 2021-06-08 DIAGNOSIS — R7989 Other specified abnormal findings of blood chemistry: Secondary | ICD-10-CM | POA: Diagnosis not present

## 2021-06-08 DIAGNOSIS — I82401 Acute embolism and thrombosis of unspecified deep veins of right lower extremity: Secondary | ICD-10-CM | POA: Diagnosis not present

## 2021-06-08 DIAGNOSIS — F0390 Unspecified dementia without behavioral disturbance: Secondary | ICD-10-CM | POA: Diagnosis not present

## 2021-06-08 DIAGNOSIS — Z299 Encounter for prophylactic measures, unspecified: Secondary | ICD-10-CM | POA: Diagnosis not present

## 2021-06-08 DIAGNOSIS — F0280 Dementia in other diseases classified elsewhere without behavioral disturbance: Secondary | ICD-10-CM | POA: Diagnosis not present

## 2021-06-08 DIAGNOSIS — I82409 Acute embolism and thrombosis of unspecified deep veins of unspecified lower extremity: Secondary | ICD-10-CM | POA: Diagnosis not present

## 2021-06-08 DIAGNOSIS — I4891 Unspecified atrial fibrillation: Secondary | ICD-10-CM | POA: Diagnosis not present

## 2021-06-08 DIAGNOSIS — R2991 Unspecified symptoms and signs involving the musculoskeletal system: Secondary | ICD-10-CM | POA: Diagnosis not present

## 2021-06-08 DIAGNOSIS — I10 Essential (primary) hypertension: Secondary | ICD-10-CM | POA: Diagnosis not present

## 2021-06-08 DIAGNOSIS — M6281 Muscle weakness (generalized): Secondary | ICD-10-CM | POA: Diagnosis not present

## 2021-06-08 DIAGNOSIS — E079 Disorder of thyroid, unspecified: Secondary | ICD-10-CM | POA: Diagnosis not present

## 2021-06-08 DIAGNOSIS — G309 Alzheimer's disease, unspecified: Secondary | ICD-10-CM | POA: Diagnosis not present

## 2021-06-08 DIAGNOSIS — F339 Major depressive disorder, recurrent, unspecified: Secondary | ICD-10-CM | POA: Diagnosis not present

## 2021-06-08 DIAGNOSIS — N179 Acute kidney failure, unspecified: Secondary | ICD-10-CM | POA: Diagnosis not present

## 2021-06-08 DIAGNOSIS — Z79890 Hormone replacement therapy (postmenopausal): Secondary | ICD-10-CM | POA: Diagnosis not present

## 2021-06-08 DIAGNOSIS — E871 Hypo-osmolality and hyponatremia: Secondary | ICD-10-CM | POA: Diagnosis not present

## 2021-06-08 DIAGNOSIS — R41841 Cognitive communication deficit: Secondary | ICD-10-CM | POA: Diagnosis not present

## 2021-06-08 DIAGNOSIS — R2689 Other abnormalities of gait and mobility: Secondary | ICD-10-CM | POA: Diagnosis not present

## 2021-06-08 DIAGNOSIS — M86172 Other acute osteomyelitis, left ankle and foot: Secondary | ICD-10-CM | POA: Diagnosis not present

## 2021-06-08 DIAGNOSIS — M869 Osteomyelitis, unspecified: Secondary | ICD-10-CM | POA: Diagnosis not present

## 2021-06-08 DIAGNOSIS — E1142 Type 2 diabetes mellitus with diabetic polyneuropathy: Secondary | ICD-10-CM | POA: Diagnosis not present

## 2021-06-08 DIAGNOSIS — E1159 Type 2 diabetes mellitus with other circulatory complications: Secondary | ICD-10-CM | POA: Diagnosis not present

## 2021-06-08 DIAGNOSIS — E1169 Type 2 diabetes mellitus with other specified complication: Secondary | ICD-10-CM | POA: Diagnosis not present

## 2021-06-11 DIAGNOSIS — E1142 Type 2 diabetes mellitus with diabetic polyneuropathy: Secondary | ICD-10-CM | POA: Diagnosis not present

## 2021-06-11 DIAGNOSIS — M869 Osteomyelitis, unspecified: Secondary | ICD-10-CM | POA: Diagnosis not present

## 2021-06-11 DIAGNOSIS — I82409 Acute embolism and thrombosis of unspecified deep veins of unspecified lower extremity: Secondary | ICD-10-CM | POA: Diagnosis not present

## 2021-06-11 DIAGNOSIS — F339 Major depressive disorder, recurrent, unspecified: Secondary | ICD-10-CM | POA: Diagnosis not present

## 2021-06-17 DIAGNOSIS — E1142 Type 2 diabetes mellitus with diabetic polyneuropathy: Secondary | ICD-10-CM | POA: Diagnosis not present

## 2021-06-17 DIAGNOSIS — I10 Essential (primary) hypertension: Secondary | ICD-10-CM | POA: Diagnosis not present

## 2021-06-17 DIAGNOSIS — F0280 Dementia in other diseases classified elsewhere without behavioral disturbance: Secondary | ICD-10-CM | POA: Diagnosis not present

## 2021-06-17 DIAGNOSIS — Z299 Encounter for prophylactic measures, unspecified: Secondary | ICD-10-CM | POA: Diagnosis not present

## 2021-06-17 DIAGNOSIS — G309 Alzheimer's disease, unspecified: Secondary | ICD-10-CM | POA: Diagnosis not present

## 2021-06-24 DIAGNOSIS — L089 Local infection of the skin and subcutaneous tissue, unspecified: Secondary | ICD-10-CM | POA: Diagnosis not present

## 2021-06-24 DIAGNOSIS — E1169 Type 2 diabetes mellitus with other specified complication: Secondary | ICD-10-CM | POA: Diagnosis not present

## 2021-06-24 DIAGNOSIS — M86179 Other acute osteomyelitis, unspecified ankle and foot: Secondary | ICD-10-CM | POA: Diagnosis not present

## 2021-06-24 DIAGNOSIS — E079 Disorder of thyroid, unspecified: Secondary | ICD-10-CM | POA: Diagnosis not present

## 2021-06-24 DIAGNOSIS — Z79890 Hormone replacement therapy (postmenopausal): Secondary | ICD-10-CM | POA: Diagnosis not present

## 2021-06-24 DIAGNOSIS — I152 Hypertension secondary to endocrine disorders: Secondary | ICD-10-CM | POA: Diagnosis not present

## 2021-06-24 DIAGNOSIS — I82411 Acute embolism and thrombosis of right femoral vein: Secondary | ICD-10-CM | POA: Diagnosis not present

## 2021-06-24 DIAGNOSIS — E1159 Type 2 diabetes mellitus with other circulatory complications: Secondary | ICD-10-CM | POA: Diagnosis not present

## 2021-06-24 DIAGNOSIS — M869 Osteomyelitis, unspecified: Secondary | ICD-10-CM | POA: Diagnosis not present

## 2021-06-24 DIAGNOSIS — M1990 Unspecified osteoarthritis, unspecified site: Secondary | ICD-10-CM | POA: Diagnosis not present

## 2021-06-24 DIAGNOSIS — F0390 Unspecified dementia without behavioral disturbance: Secondary | ICD-10-CM | POA: Diagnosis not present

## 2021-07-10 DIAGNOSIS — M869 Osteomyelitis, unspecified: Secondary | ICD-10-CM | POA: Diagnosis not present

## 2021-07-10 DIAGNOSIS — I10 Essential (primary) hypertension: Secondary | ICD-10-CM | POA: Diagnosis not present

## 2021-07-10 DIAGNOSIS — E440 Moderate protein-calorie malnutrition: Secondary | ICD-10-CM | POA: Diagnosis not present

## 2021-07-10 DIAGNOSIS — I82409 Acute embolism and thrombosis of unspecified deep veins of unspecified lower extremity: Secondary | ICD-10-CM | POA: Diagnosis not present

## 2021-07-10 DIAGNOSIS — Z299 Encounter for prophylactic measures, unspecified: Secondary | ICD-10-CM | POA: Diagnosis not present

## 2021-07-14 DIAGNOSIS — E860 Dehydration: Secondary | ICD-10-CM | POA: Diagnosis not present

## 2021-07-14 DIAGNOSIS — E871 Hypo-osmolality and hyponatremia: Secondary | ICD-10-CM | POA: Diagnosis not present

## 2021-07-14 DIAGNOSIS — E119 Type 2 diabetes mellitus without complications: Secondary | ICD-10-CM | POA: Diagnosis not present

## 2021-07-14 DIAGNOSIS — N179 Acute kidney failure, unspecified: Secondary | ICD-10-CM | POA: Diagnosis not present

## 2021-08-03 DIAGNOSIS — R0689 Other abnormalities of breathing: Secondary | ICD-10-CM | POA: Diagnosis not present

## 2021-08-03 DIAGNOSIS — Z91199 Patient's noncompliance with other medical treatment and regimen due to unspecified reason: Secondary | ICD-10-CM | POA: Diagnosis not present

## 2021-08-03 DIAGNOSIS — I10 Essential (primary) hypertension: Secondary | ICD-10-CM | POA: Diagnosis not present

## 2021-08-03 DIAGNOSIS — F0390 Unspecified dementia without behavioral disturbance: Secondary | ICD-10-CM | POA: Diagnosis not present

## 2021-08-03 DIAGNOSIS — I4891 Unspecified atrial fibrillation: Secondary | ICD-10-CM | POA: Diagnosis not present

## 2021-08-03 DIAGNOSIS — I4510 Unspecified right bundle-branch block: Secondary | ICD-10-CM | POA: Diagnosis not present

## 2021-08-03 DIAGNOSIS — R000 Tachycardia, unspecified: Secondary | ICD-10-CM | POA: Diagnosis not present

## 2021-08-03 DIAGNOSIS — Z743 Need for continuous supervision: Secondary | ICD-10-CM | POA: Diagnosis not present

## 2021-08-03 DIAGNOSIS — E119 Type 2 diabetes mellitus without complications: Secondary | ICD-10-CM | POA: Diagnosis not present

## 2021-08-03 DIAGNOSIS — I469 Cardiac arrest, cause unspecified: Secondary | ICD-10-CM | POA: Diagnosis not present

## 2021-08-26 DEATH — deceased

## 2021-10-12 ENCOUNTER — Other Ambulatory Visit: Payer: Self-pay | Admitting: Orthopaedic Surgery

## 2021-10-12 DIAGNOSIS — M545 Low back pain, unspecified: Secondary | ICD-10-CM

## 2022-02-27 IMAGING — CT CT HEAD W/O CM
3 series · 14 of 47 positions shown, 16 images · non-contrast
Comparison: Head CT 05/25/2020.

CLINICAL DATA: Delirium. Additional history provided: Patient found
unresponsive by wife, alert and oriented upon arrival to ED, patient
reports generalized numbness.

EXAM:
CT HEAD WITHOUT CONTRAST
TECHNIQUE: Contiguous axial images were obtained from the base of the skull
through the vertex without intravenous contrast.

[Series 3: head 5.0 h30s · axial · 0.45mm/px · z∈[-129,+11]mm · 8 of 34 slices shown, 10 images]
[im 3/34  brain]
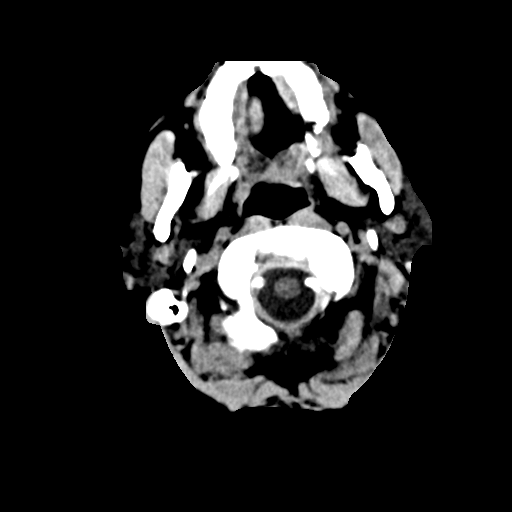
[im 3/34  bone]
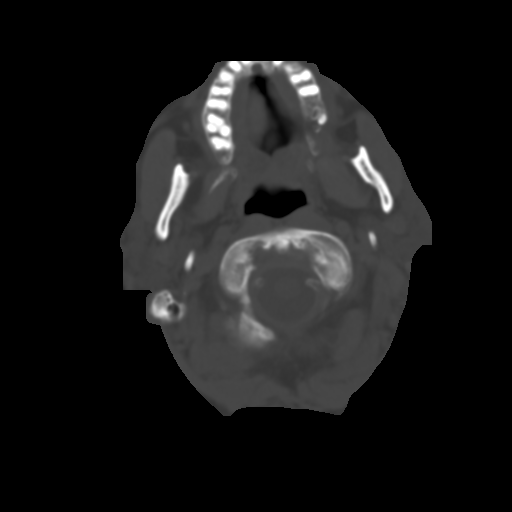
[im 7/34  brain]
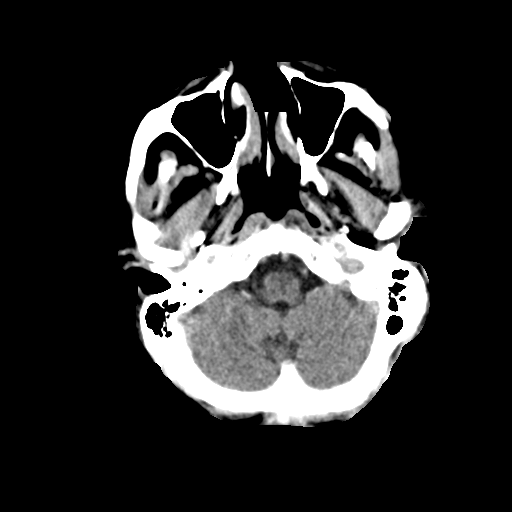
[im 11/34  brain]
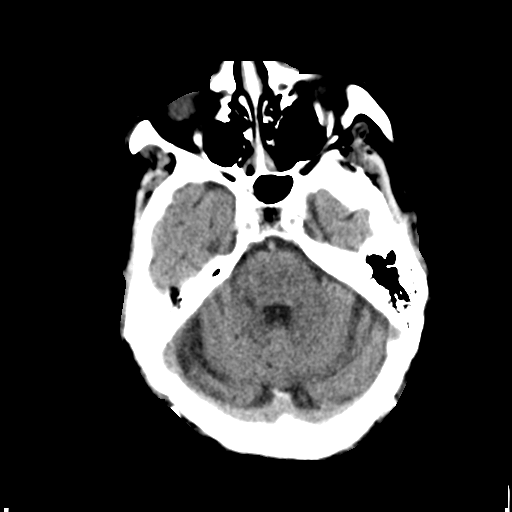
[im 15/34  brain]
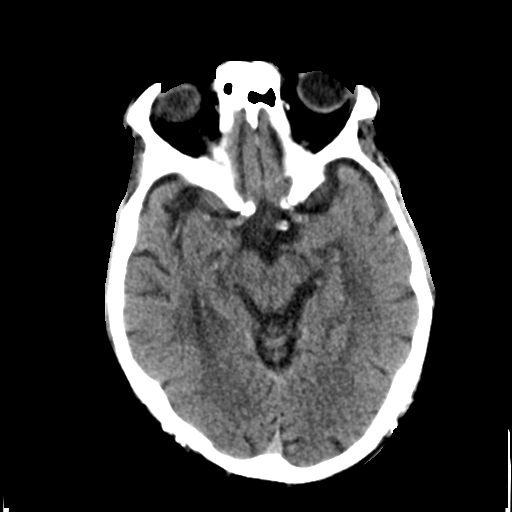
[im 19/34  brain]
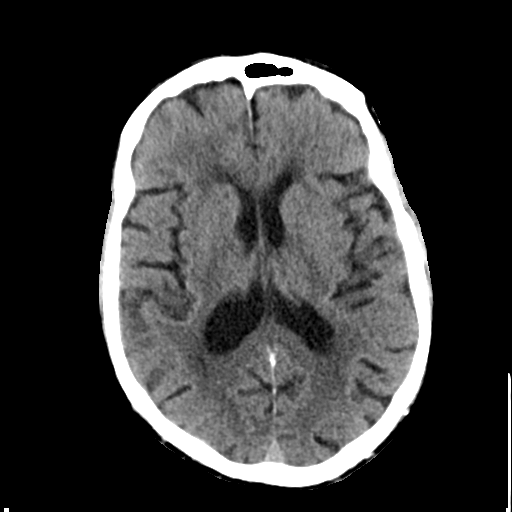
[im 19/34  bone]
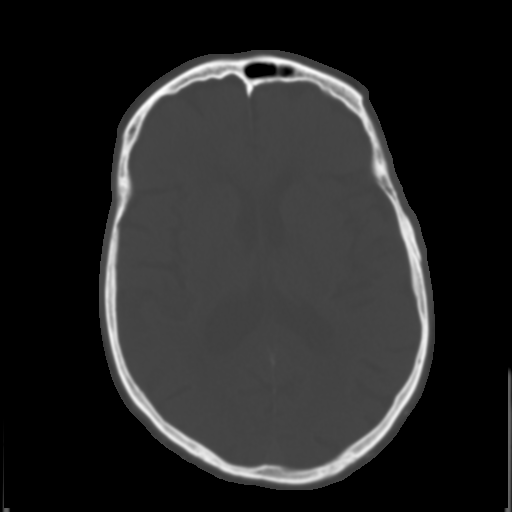
[im 23/34  brain]
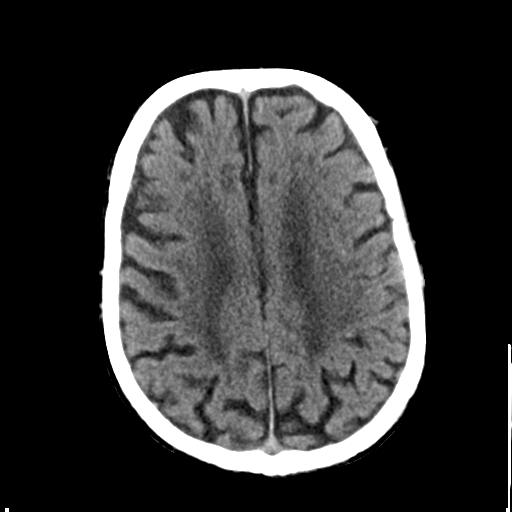
[im 27/34  brain]
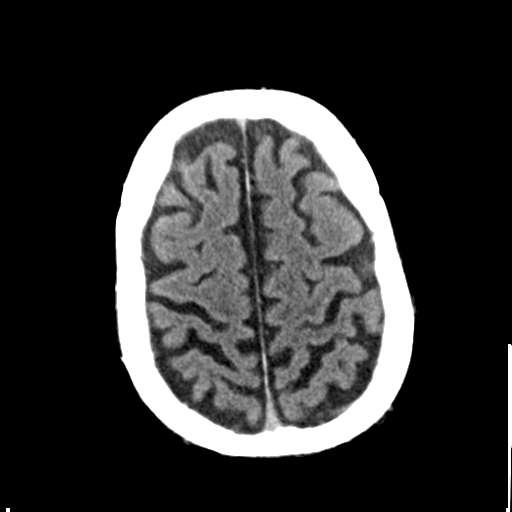
[im 31/34  brain]
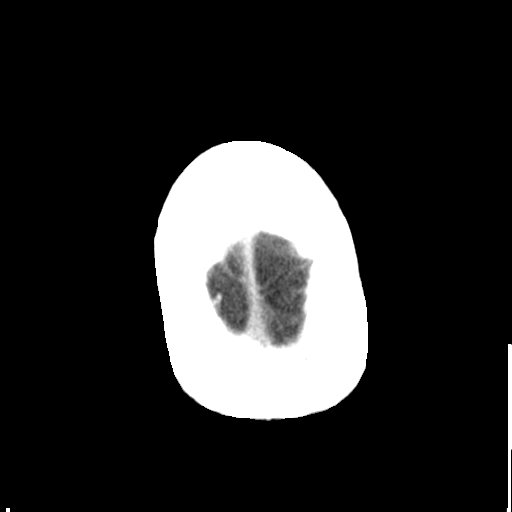

[Series 5: head 3.0 mpr cor · coronal · 0.33mm/px · 3 of 73 slices shown]
[im 25/73  brain]
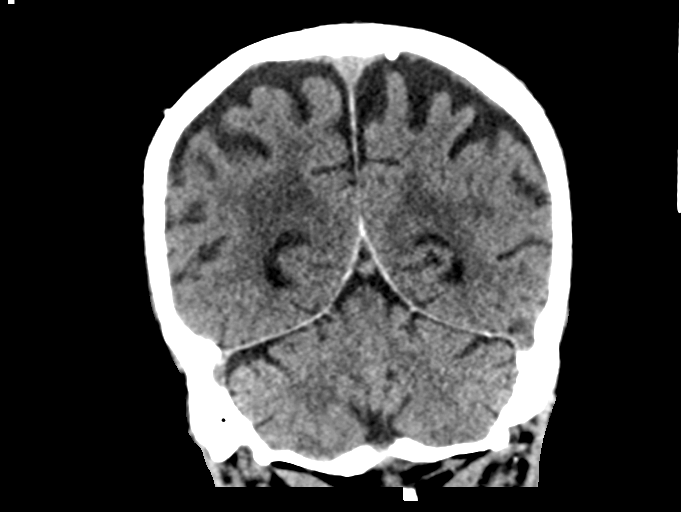
[im 33/73  brain]
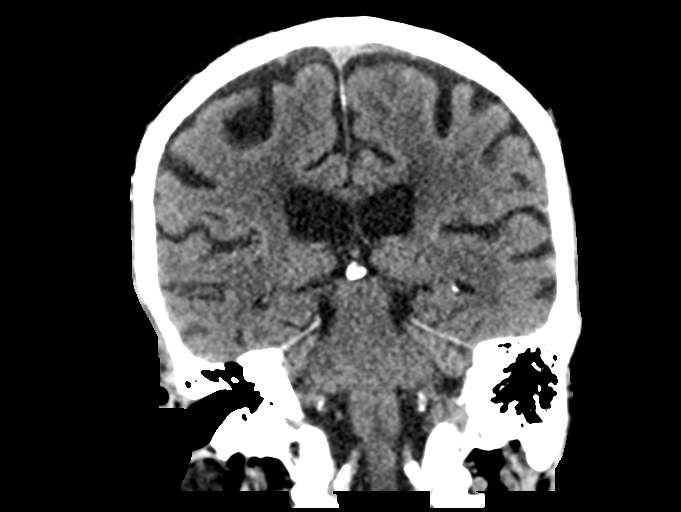
[im 41/73  brain]
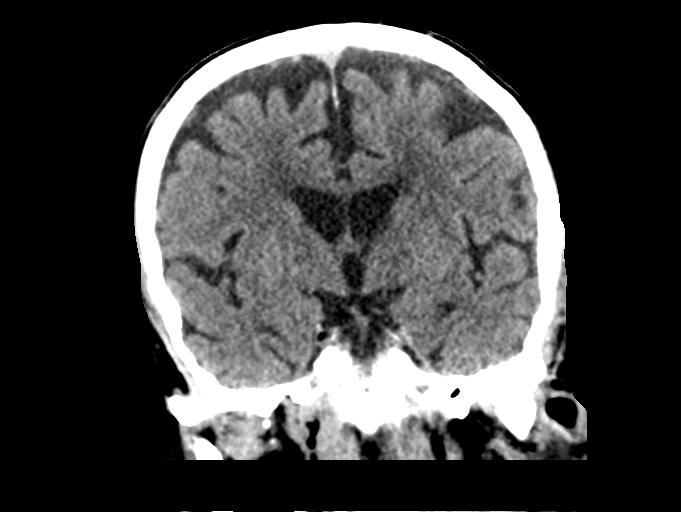

[Series 6: head 3.0 mpr sag · sagittal · 0.35mm/px · 3 of 65 slices shown]
[im 22/65  brain]
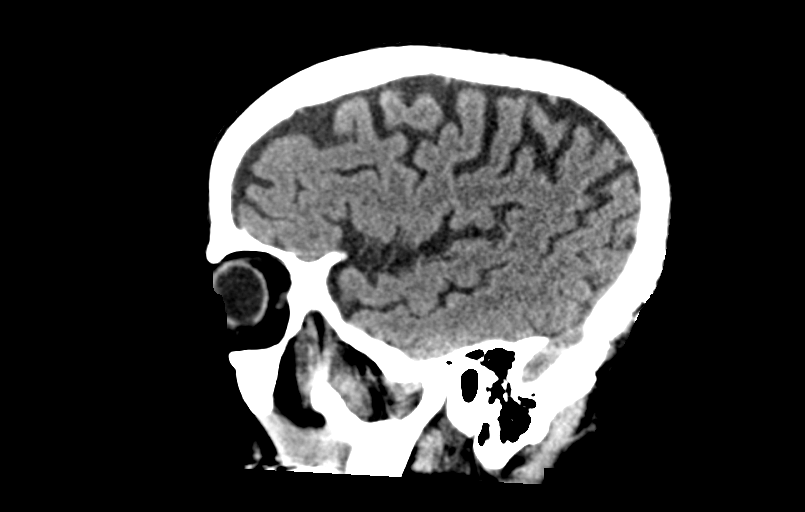
[im 33/65  brain]
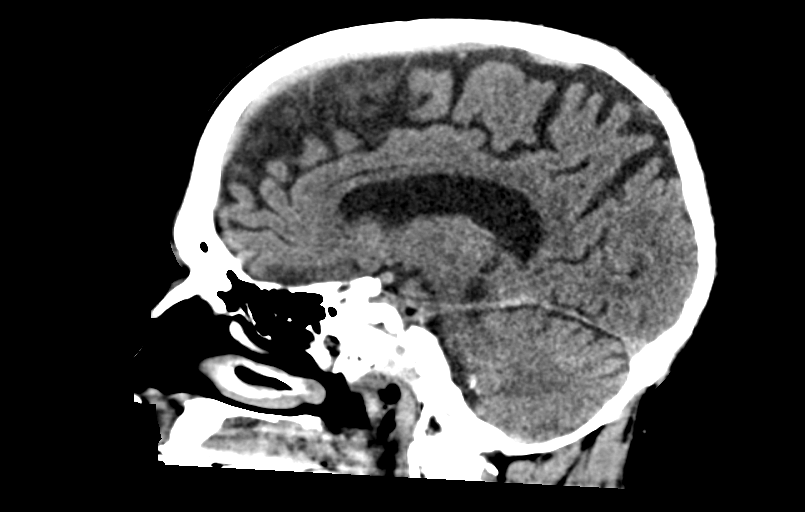
[im 43/65  brain]
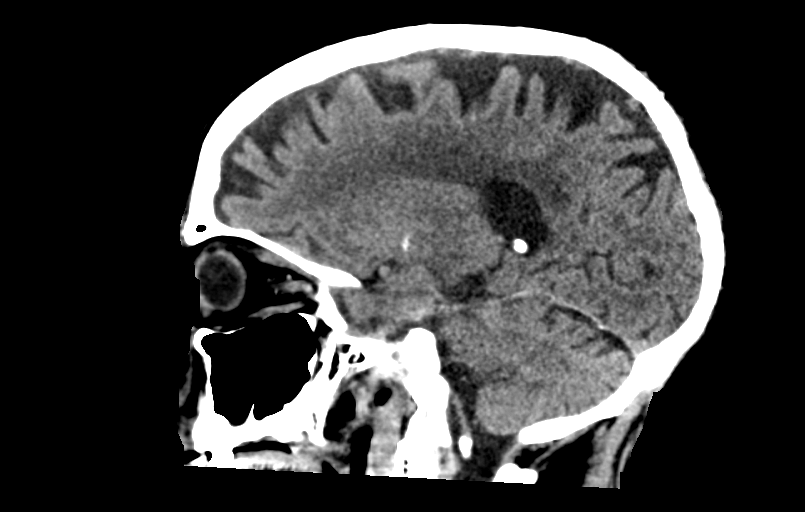

[14 of 47 positions shown; findings below may reference images not displayed]

FINDINGS: Brain:

Moderate cerebral and cerebellar atrophy.

Moderately advanced ill-defined hypoattenuation within the cerebral
white matter is nonspecific, but compatible with chronic small
vessel ischemic disease.

There is no acute intracranial hemorrhage.

No demarcated cortical infarct.

No extra-axial fluid collection.

No evidence of intracranial mass.

No midline shift.

Vascular: No hyperdense vessel.  Atherosclerotic calcifications.

Skull: Normal. Negative for fracture or focal lesion.

Sinuses/Orbits: Visualized orbits show no acute finding. Mild
scattered paranasal sinus mucosal thickening.
IMPRESSION: No evidence of acute intracranial abnormality.

Moderate generalized atrophy of the brain with moderately advanced
cerebral white matter chronic small vessel ischemic disease, stable
as compared to the head CT of 05/25/2020.

Mild paranasal sinus mucosal thickening.
